# Patient Record
Sex: Female | Born: 1935 | Race: Black or African American | Hispanic: No | Smoking: Current every day smoker
Health system: Southern US, Community
[De-identification: ages and names within clinical notes are randomized; demographics above are authoritative.]

## PROBLEM LIST (undated history)

## (undated) DIAGNOSIS — M1990 Unspecified osteoarthritis, unspecified site: Secondary | ICD-10-CM

## (undated) DIAGNOSIS — C799 Secondary malignant neoplasm of unspecified site: Principal | ICD-10-CM

## (undated) DIAGNOSIS — I10 Essential (primary) hypertension: Secondary | ICD-10-CM

## (undated) HISTORY — DX: Secondary malignant neoplasm of unspecified site: C79.9

## (undated) HISTORY — PX: ECTOPIC PREGNANCY SURGERY: SHX613

---

## 1997-05-21 ENCOUNTER — Other Ambulatory Visit: Admission: RE | Admit: 1997-05-21 | Discharge: 1997-05-21 | Payer: Self-pay | Admitting: *Deleted

## 1997-11-20 ENCOUNTER — Emergency Department (HOSPITAL_COMMUNITY): Admission: EM | Admit: 1997-11-20 | Discharge: 1997-11-20 | Payer: Self-pay | Admitting: Emergency Medicine

## 1997-11-21 ENCOUNTER — Encounter: Payer: Self-pay | Admitting: Emergency Medicine

## 1999-09-22 ENCOUNTER — Emergency Department (HOSPITAL_COMMUNITY): Admission: EM | Admit: 1999-09-22 | Discharge: 1999-09-22 | Payer: Self-pay | Admitting: Emergency Medicine

## 1999-09-22 ENCOUNTER — Encounter: Payer: Self-pay | Admitting: Emergency Medicine

## 2010-12-17 ENCOUNTER — Emergency Department (HOSPITAL_COMMUNITY)
Admission: EM | Admit: 2010-12-17 | Discharge: 2010-12-18 | Disposition: A | Discharge status: Home / Self Care | Payer: Medicare Other | Attending: Emergency Medicine | Admitting: Emergency Medicine

## 2010-12-17 ENCOUNTER — Encounter: Payer: Self-pay | Admitting: *Deleted

## 2010-12-17 ENCOUNTER — Emergency Department (HOSPITAL_COMMUNITY): Payer: Medicare Other

## 2010-12-17 DIAGNOSIS — J40 Bronchitis, not specified as acute or chronic: Secondary | ICD-10-CM | POA: Insufficient documentation

## 2010-12-17 DIAGNOSIS — R0602 Shortness of breath: Secondary | ICD-10-CM | POA: Insufficient documentation

## 2010-12-17 DIAGNOSIS — J3489 Other specified disorders of nose and nasal sinuses: Secondary | ICD-10-CM | POA: Insufficient documentation

## 2010-12-17 DIAGNOSIS — E119 Type 2 diabetes mellitus without complications: Secondary | ICD-10-CM | POA: Insufficient documentation

## 2010-12-17 DIAGNOSIS — R509 Fever, unspecified: Secondary | ICD-10-CM | POA: Insufficient documentation

## 2010-12-17 DIAGNOSIS — I10 Essential (primary) hypertension: Secondary | ICD-10-CM | POA: Insufficient documentation

## 2010-12-17 DIAGNOSIS — J111 Influenza due to unidentified influenza virus with other respiratory manifestations: Secondary | ICD-10-CM | POA: Insufficient documentation

## 2010-12-17 HISTORY — DX: Essential (primary) hypertension: I10

## 2010-12-17 HISTORY — DX: Unspecified osteoarthritis, unspecified site: M19.90

## 2010-12-17 LAB — BASIC METABOLIC PANEL
BUN: 9 mg/dL (ref 6–23)
CO2: 29 mEq/L (ref 19–32)
Calcium: 9.7 mg/dL (ref 8.4–10.5)
GFR calc non Af Amer: 79 mL/min — ABNORMAL LOW (ref >90)
Glucose, Bld: 110 mg/dL — ABNORMAL HIGH (ref 70–99)
Potassium: 3.5 mEq/L (ref 3.5–5.1)

## 2010-12-17 LAB — DIFFERENTIAL
Basophils Absolute: 0.1 10*3/uL (ref 0.0–0.1)
Basophils Relative: 1 % (ref 0–1)
Eosinophils Absolute: 0.3 10*3/uL (ref 0.0–0.7)
Lymphocytes Relative: 15 % (ref 12–46)
Lymphs Abs: 0.9 10*3/uL (ref 0.7–4.0)
Monocytes Absolute: 0.7 10*3/uL (ref 0.1–1.0)
Neutro Abs: 4 10*3/uL (ref 1.7–7.7)

## 2010-12-17 LAB — CBC
HCT: 42.6 % (ref 36.0–46.0)
Hemoglobin: 14.7 g/dL (ref 12.0–15.0)
MCH: 32.2 pg (ref 26.0–34.0)
MCHC: 34.5 g/dL (ref 30.0–36.0)
RDW: 12.2 % (ref 11.5–15.5)

## 2010-12-17 LAB — TROPONIN I: Troponin I: <0.3 ng/mL (ref <0.30)

## 2010-12-17 MED ORDER — CLONIDINE HCL 0.1 MG PO TABS
0.1000 mg | ORAL_TABLET | Freq: Once | ORAL | Status: AC
  Administered 2010-12-17: 0.1 mg via ORAL
  Filled 2010-12-17: qty 1

## 2010-12-17 MED ORDER — DEXTROSE 5 % IV SOLN
1.0000 g | Freq: Once | INTRAVENOUS | Status: AC
  Administered 2010-12-17: 1 g via INTRAVENOUS
  Filled 2010-12-17: qty 10

## 2010-12-17 MED ORDER — MOXIFLOXACIN HCL IN NACL 400 MG/250ML IV SOLN
400.0000 mg | Freq: Once | INTRAVENOUS | Status: AC
  Administered 2010-12-17: 400 mg via INTRAVENOUS
  Filled 2010-12-17: qty 250

## 2010-12-17 MED ORDER — ACETAMINOPHEN 325 MG PO TABS
650.0000 mg | ORAL_TABLET | Freq: Once | ORAL | Status: AC
  Administered 2010-12-17: 650 mg via ORAL
  Filled 2010-12-17: qty 2

## 2010-12-17 NOTE — ED Notes (Signed)
Pt has had URI for the past 2 days.  Pt has been sob.  No fever or chills with this

## 2010-12-17 NOTE — ED Notes (Signed)
Dr. Doylene Canard @ at bedside evaluating patient

## 2010-12-17 NOTE — ED Notes (Addendum)
PT placed on O2 at 2L 

## 2010-12-18 MED ORDER — OSELTAMIVIR PHOSPHATE 75 MG PO CAPS: 75.0000 mg | ORAL_CAPSULE | Freq: Two times a day (BID) | ORAL | Status: AC

## 2010-12-18 MED ORDER — ALBUTEROL SULFATE HFA 108 (90 BASE) MCG/ACT IN AERS: 2.0000 | INHALATION_SPRAY | RESPIRATORY_TRACT | Status: DC | PRN

## 2010-12-18 MED ORDER — ALBUTEROL SULFATE HFA 108 (90 BASE) MCG/ACT IN AERS
2.0000 | INHALATION_SPRAY | RESPIRATORY_TRACT | Status: DC | PRN
  Administered 2010-12-18: 2 via RESPIRATORY_TRACT
  Filled 2010-12-18: qty 6.7

## 2010-12-18 MED ORDER — PREDNISONE 20 MG PO TABS
60.0000 mg | ORAL_TABLET | Freq: Once | ORAL | Status: AC
  Administered 2010-12-18: 60 mg via ORAL
  Filled 2010-12-18: qty 3

## 2010-12-18 MED ORDER — OSELTAMIVIR PHOSPHATE 75 MG PO CAPS
75.0000 mg | ORAL_CAPSULE | ORAL | Status: AC
  Administered 2010-12-18: 75 mg via ORAL
  Filled 2010-12-18: qty 1

## 2010-12-18 MED ORDER — HYDROCODONE-ACETAMINOPHEN 5-325 MG PO TABS: 2.0000 | ORAL_TABLET | ORAL | Status: AC | PRN

## 2010-12-18 MED ORDER — PREDNISONE 20 MG PO TABS: 40.0000 mg | ORAL_TABLET | Freq: Every day | ORAL | Status: AC

## 2010-12-18 NOTE — ED Provider Notes (Signed)
History     CSN: [id number] Arrival date & time: 12/17/2010  8:15 PM   First MD Initiated Contact with Patient 12/17/10 2101      Chief Complaint  Patient presents with  . Shortness of Breath    (Consider location/radiation/quality/duration/timing/severity/associated sxs/prior treatment) Patient is a 77 y.o. female presenting with shortness of breath. The history is provided by the patient and a relative.  Shortness of Breath  The current episode started 2 days ago. The onset was gradual. The problem occurs frequently. The problem has been unchanged. The problem is mild. The symptoms are relieved by rest. Associated symptoms include a fever, rhinorrhea, cough and shortness of breath. Pertinent negatives include no chest pain, no chest pressure, no orthopnea, no sore throat, no stridor and no wheezing. The fever has been present for less than 1 day. The maximum temperature noted was 101.0 to 102.1 F. The temperature was taken using an oral thermometer. The cough is productive. There is no color change associated with the cough. Nothing relieves the cough. The cough is worsened by activity. The rhinorrhea has been occurring frequently. The nasal discharge has a clear appearance. She was not exposed to toxic fumes. She has not inhaled smoke recently. She has had no prior steroid use. Her past medical history does not include asthma. She has been behaving normally. Urine output has been normal. There were no sick contacts. She has received no recent medical care.    Past Medical History  Diagnosis Date  . Hypertension   . Diabetes mellitus   . Arthritis     Past Surgical History  Procedure Date  . Ectopic pregnancy surgery     History reviewed. No pertinent family history.  History  Substance Use Topics  . Smoking status: Not on file  . Smokeless tobacco: Not on file  . Alcohol Use: No    OB History    Grav Para Term Preterm Abortions TAB SAB Ect Mult Living                   Review of Systems  Constitutional: Positive for fever and fatigue. Negative for chills, diaphoresis, activity change, appetite change and unexpected weight change.  HENT: Positive for congestion, rhinorrhea and postnasal drip. Negative for hearing loss, ear pain, nosebleeds, sore throat, facial swelling, sneezing, drooling, mouth sores, trouble swallowing, neck pain, neck stiffness, dental problem, voice change, sinus pressure, tinnitus and ear discharge.   Eyes: Negative for photophobia, pain, discharge, redness and itching.  Respiratory: Positive for cough and shortness of breath. Negative for choking, chest tightness, wheezing and stridor.   Cardiovascular: Negative for chest pain, palpitations, orthopnea and leg swelling.  Gastrointestinal: Negative for nausea, vomiting, abdominal pain, diarrhea, constipation, blood in stool, abdominal distention and anal bleeding.  Genitourinary: Negative for dysuria, urgency, frequency, hematuria, flank pain and difficulty urinating.  Musculoskeletal: Positive for myalgias. Negative for back pain, joint swelling, arthralgias and gait problem.  Skin: Negative for color change, pallor, rash and wound.  Neurological: Negative for dizziness, weakness, light-headedness and headaches.  Hematological: Positive for adenopathy. Does not bruise/bleed easily.  Psychiatric/Behavioral: Negative.     Allergies  Review of patient's allergies indicates no known allergies.  Home Medications   Current Outpatient Rx  Name Route Sig Dispense Refill  . VITAMIN C PO Oral Take 1 tablet by mouth daily.      Marland Kitchen VITAMIN B 12 PO Oral Take by mouth.      Marland Kitchen HYDROCHLOROTHIAZIDE 12.5 MG PO CAPS Oral Take  12.5 mg by mouth daily.      Marland Kitchen VERAPAMIL HCL 180 MG (CO) PO TB24 Oral Take 180 mg by mouth at bedtime.      Marland Kitchen VITAMIN E PO Oral Take 1 tablet by mouth daily.      . ALBUTEROL SULFATE HFA 108 (90 BASE) MCG/ACT IN AERS Inhalation Inhale 2 puffs into the lungs every 4 (four)  hours as needed for wheezing or shortness of breath. 1 Inhaler 0  . HYDROCODONE-ACETAMINOPHEN 5-325 MG PO TABS Oral Take 2 tablets by mouth every 4 (four) hours as needed for pain (cough). 20 tablet 0  . OSELTAMIVIR PHOSPHATE 75 MG PO CAPS Oral Take 1 capsule (75 mg total) by mouth 2 (two) times daily. 10 capsule 0  . PREDNISONE 20 MG PO TABS Oral Take 2 tablets (40 mg total) by mouth daily. 10 tablet 0    BP 203/100  Pulse 85  Temp(Src) 101.4 F (38.6 C) (Oral)  Resp 18  SpO2 95%  Physical Exam  Nursing note and vitals reviewed. Constitutional: She is oriented to person, place, and time. She appears well-developed and well-nourished. No distress.  HENT:  Head: Normocephalic and atraumatic.  Right Ear: External ear normal.  Left Ear: External ear normal.  Nose: Mucosal edema and rhinorrhea present. No sinus tenderness. Right sinus exhibits no maxillary sinus tenderness and no frontal sinus tenderness. Left sinus exhibits no maxillary sinus tenderness and no frontal sinus tenderness.  Mouth/Throat: Oropharynx is clear and moist. No oropharyngeal exudate.  Eyes: Conjunctivae and EOM are normal. Pupils are equal, round, and reactive to light. Right eye exhibits no discharge. Left eye exhibits no discharge.  Neck: Normal range of motion. Neck supple. No JVD present. No tracheal deviation present.  Cardiovascular: Normal rate, regular rhythm, normal heart sounds and intact distal pulses.  Exam reveals no gallop and no friction rub.   No murmur heard. Pulmonary/Chest: Breath sounds normal. No accessory muscle usage or stridor. Tachypnea noted. No respiratory distress. She has no decreased breath sounds. She has no wheezes. She has no rhonchi. She has no rales. She exhibits no tenderness.  Abdominal: Soft. Bowel sounds are normal. She exhibits no distension. There is no tenderness. There is no rebound and no guarding.  Musculoskeletal: Normal range of motion. She exhibits no edema and no  tenderness.  Lymphadenopathy:    She has no cervical adenopathy.  Neurological: She is alert and oriented to person, place, and time. She has normal reflexes. No cranial nerve deficit. She exhibits normal muscle tone. Coordination normal.  Skin: Skin is warm and dry. No rash noted. She is not diaphoretic. No erythema. No pallor.  Psychiatric: She has a normal mood and affect. Her behavior is normal. Judgment and thought content normal.    ED Course  Procedures (including critical care time)  Labs Reviewed  BASIC METABOLIC PANEL - Abnormal; Notable for the following:    Sodium 128 (*)    Chloride 89 (*)    Glucose, Bld 110 (*)    GFR calc non Af Amer 79 (*)    All other components within normal limits  CBC  DIFFERENTIAL  PRO B NATRIURETIC PEPTIDE  TROPONIN I  CULTURE, BLOOD (ROUTINE X 2)  CULTURE, BLOOD (ROUTINE X 2)   Dg Pneumonia Chest 2v  12/17/2010  *RADIOLOGY REPORT*  Clinical Data: Severe shortness of breath; fever, hypoxia, cough and weakness.  Hypertension.  CHEST - 2 VIEW  Comparison: None.  Findings: The lungs are well-aerated.  Minimal bibasilar opacities  likely reflect atelectasis.  There is no evidence of pleural effusion or pneumothorax.  The heart is normal in size; calcification is noted within the aortic arch.  No acute osseous abnormalities are seen.  IMPRESSION: Minimal bibasilar airspace opacities likely reflect atelectasis.  Original Report Authenticated By: Tonia Ghent, M.D.     1. Influenza   2. Bronchitis       MDM  The patient appears to have influenza-like symptoms, with a fever, and likely actual influenza. I have treated her similar to a bronchitis as well, with steroid and beta agonist inhaler for her cough which has provided improvement for her. There is no pneumonia apparent on her chest x-ray. Her blood work is normal. The patient is at this time in no apparent distress, and I went by her bedside to reevaluate her, with improved air exchange,  decreased tachypnea, and improved overall appearance, I asked the patient directly if she felt well enough to go home where she felt that she needed admission, as the picture was equivocal from my perspective and I was interested in her subjective impression on how she was doing. She stated that she felt well enough to go home and would like to be discharged home to followup with her primary care physician. I reviewed return symptoms with her and advised her to come back to the emergency department if symptoms worsen. She stated her understanding of and agreement with the plan of care.        Felisa Bonier, MD 12/18/10 [phone]

## 2010-12-18 NOTE — ED Notes (Signed)
Provided education to pt and family regarding albuterol inhaler. Pt verbalizes understanding and able to correctly return demonstration

## 2010-12-24 LAB — CULTURE, BLOOD (ROUTINE X 2): Culture  Setup Time: 201211290410

## 2011-01-13 ENCOUNTER — Encounter (HOSPITAL_COMMUNITY): Payer: Self-pay | Admitting: *Deleted

## 2011-01-13 ENCOUNTER — Emergency Department (HOSPITAL_COMMUNITY): Payer: Medicare Other

## 2011-01-13 ENCOUNTER — Inpatient Hospital Stay (HOSPITAL_COMMUNITY)
Admission: EM | Admit: 2011-01-13 | Discharge: 2011-01-17 | DRG: 156 | Disposition: A | Discharge status: Home / Self Care | Payer: Medicare Other | Attending: Internal Medicine | Admitting: Internal Medicine

## 2011-01-13 DIAGNOSIS — F17200 Nicotine dependence, unspecified, uncomplicated: Secondary | ICD-10-CM | POA: Diagnosis present

## 2011-01-13 DIAGNOSIS — E119 Type 2 diabetes mellitus without complications: Secondary | ICD-10-CM | POA: Diagnosis present

## 2011-01-13 DIAGNOSIS — K118 Other diseases of salivary glands: Secondary | ICD-10-CM

## 2011-01-13 DIAGNOSIS — K1120 Sialoadenitis, unspecified: Principal | ICD-10-CM | POA: Diagnosis present

## 2011-01-13 DIAGNOSIS — I10 Essential (primary) hypertension: Secondary | ICD-10-CM | POA: Diagnosis present

## 2011-01-13 LAB — COMPREHENSIVE METABOLIC PANEL
AST: 28 U/L (ref 0–37)
Albumin: 3.1 g/dL — ABNORMAL LOW (ref 3.5–5.2)
BUN: 11 mg/dL (ref 6–23)
Calcium: 9.6 mg/dL (ref 8.4–10.5)
Chloride: 95 mEq/L — ABNORMAL LOW (ref 96–112)
Creatinine, Ser: 0.63 mg/dL (ref 0.50–1.10)
GFR calc non Af Amer: 82 mL/min — ABNORMAL LOW (ref >90)
Total Bilirubin: 0.5 mg/dL (ref 0.3–1.2)

## 2011-01-13 LAB — CREATININE, SERUM
Creatinine, Ser: 0.59 mg/dL (ref 0.50–1.10)
GFR calc non Af Amer: 84 mL/min — ABNORMAL LOW (ref >90)

## 2011-01-13 LAB — DIFFERENTIAL
Basophils Absolute: 0.1 10*3/uL (ref 0.0–0.1)
Basophils Relative: 1 % (ref 0–1)
Eosinophils Relative: 3 % (ref 0–5)
Monocytes Absolute: 1.5 10*3/uL — ABNORMAL HIGH (ref 0.1–1.0)
Monocytes Relative: 14 % — ABNORMAL HIGH (ref 3–12)
Neutro Abs: 7 10*3/uL (ref 1.7–7.7)

## 2011-01-13 LAB — CBC
HCT: 37.6 % (ref 36.0–46.0)
Hemoglobin: 13.1 g/dL (ref 12.0–15.0)
Hemoglobin: 13.3 g/dL (ref 12.0–15.0)
MCH: 32.2 pg (ref 26.0–34.0)
MCH: 32 pg (ref 26.0–34.0)
MCHC: 34.8 g/dL (ref 30.0–36.0)
MCV: 92.4 fL (ref 78.0–100.0)
RBC: 4.16 MIL/uL (ref 3.87–5.11)
RDW: 12.3 % (ref 11.5–15.5)

## 2011-01-13 LAB — C-REACTIVE PROTEIN: CRP: 10.66 mg/dL — ABNORMAL HIGH (ref <0.60)

## 2011-01-13 LAB — SEDIMENTATION RATE: Sed Rate: 75 mm/hr — ABNORMAL HIGH (ref 0–22)

## 2011-01-13 MED ORDER — SODIUM CHLORIDE 0.9 % IV BOLUS (SEPSIS)
1000.0000 mL | Freq: Once | INTRAVENOUS | Status: AC
  Administered 2011-01-13: 1000 mL via INTRAVENOUS

## 2011-01-13 MED ORDER — HYDROCHLOROTHIAZIDE 12.5 MG PO CAPS
25.0000 mg | ORAL_CAPSULE | Freq: Every day | ORAL | Status: DC
  Administered 2011-01-14 – 2011-01-17 (×4): 25 mg via ORAL
  Filled 2011-01-13 (×5): qty 2

## 2011-01-13 MED ORDER — MORPHINE SULFATE 4 MG/ML IJ SOLN
4.0000 mg | INTRAMUSCULAR | Status: DC | PRN
  Administered 2011-01-13 – 2011-01-16 (×9): 4 mg via INTRAVENOUS
  Filled 2011-01-13 (×10): qty 1

## 2011-01-13 MED ORDER — SODIUM CHLORIDE 0.9 % IV SOLN
3.0000 g | Freq: Four times a day (QID) | INTRAVENOUS | Status: DC
  Administered 2011-01-13 – 2011-01-16 (×12): 3 g via INTRAVENOUS
  Filled 2011-01-13 (×15): qty 3

## 2011-01-13 MED ORDER — VERAPAMIL HCL ER 180 MG PO TBCR
180.0000 mg | EXTENDED_RELEASE_TABLET | Freq: Every day | ORAL | Status: DC
  Administered 2011-01-14 – 2011-01-17 (×3): 180 mg via ORAL
  Filled 2011-01-13 (×5): qty 1

## 2011-01-13 MED ORDER — VERAPAMIL HCL 180 MG (CO) PO TB24: 180.0000 mg | ORAL_TABLET | Freq: Every day | ORAL | Status: DC

## 2011-01-13 MED ORDER — ACETAMINOPHEN 650 MG RE SUPP: 650.0000 mg | Freq: Four times a day (QID) | RECTAL | Status: DC | PRN

## 2011-01-13 MED ORDER — MORPHINE SULFATE 4 MG/ML IJ SOLN
4.0000 mg | Freq: Once | INTRAMUSCULAR | Status: AC
  Administered 2011-01-13: 4 mg via INTRAVENOUS
  Filled 2011-01-13: qty 1

## 2011-01-13 MED ORDER — DEXTROSE-NACL 5-0.45 % IV SOLN
INTRAVENOUS | Status: DC
  Administered 2011-01-13: 1000 mL via INTRAVENOUS
  Administered 2011-01-15 – 2011-01-16 (×2): via INTRAVENOUS

## 2011-01-13 MED ORDER — NICOTINE 21 MG/24HR TD PT24
21.0000 mg | MEDICATED_PATCH | Freq: Every day | TRANSDERMAL | Status: DC
  Administered 2011-01-13 – 2011-01-17 (×5): 21 mg via TRANSDERMAL
  Filled 2011-01-13 (×5): qty 1

## 2011-01-13 MED ORDER — ACETAMINOPHEN 325 MG PO TABS
650.0000 mg | ORAL_TABLET | Freq: Four times a day (QID) | ORAL | Status: DC | PRN
  Administered 2011-01-13 – 2011-01-14 (×2): 650 mg via ORAL
  Filled 2011-01-13 (×2): qty 2

## 2011-01-13 MED ORDER — POLYETHYLENE GLYCOL 3350 17 G PO PACK
17.0000 g | PACK | Freq: Every day | ORAL | Status: DC | PRN
  Filled 2011-01-13: qty 1

## 2011-01-13 MED ORDER — ONDANSETRON HCL 4 MG/2ML IJ SOLN
4.0000 mg | Freq: Once | INTRAMUSCULAR | Status: AC
  Administered 2011-01-13: 4 mg via INTRAVENOUS
  Filled 2011-01-13: qty 2

## 2011-01-13 MED ORDER — IOHEXOL 300 MG/ML  SOLN
75.0000 mL | Freq: Once | INTRAMUSCULAR | Status: AC | PRN
  Administered 2011-01-13: 75 mL via INTRAVENOUS

## 2011-01-13 MED ORDER — ENOXAPARIN SODIUM 40 MG/0.4ML ~~LOC~~ SOLN
40.0000 mg | SUBCUTANEOUS | Status: DC
  Administered 2011-01-13 – 2011-01-15 (×3): 40 mg via SUBCUTANEOUS
  Filled 2011-01-13 (×5): qty 0.4

## 2011-01-13 MED ORDER — SODIUM CHLORIDE 0.9 % IV SOLN
Freq: Once | INTRAVENOUS | Status: AC
  Administered 2011-01-13: 1000 mL via INTRAVENOUS

## 2011-01-13 MED ORDER — ONDANSETRON HCL 4 MG PO TABS: 4.0000 mg | ORAL_TABLET | Freq: Four times a day (QID) | ORAL | Status: DC | PRN

## 2011-01-13 MED ORDER — HYDROMORPHONE HCL PF 1 MG/ML IJ SOLN
0.5000 mg | Freq: Once | INTRAMUSCULAR | Status: AC
  Administered 2011-01-13: 0.5 mg via INTRAVENOUS
  Filled 2011-01-13: qty 1

## 2011-01-13 MED ORDER — SODIUM CHLORIDE 0.9 % IV SOLN
3.0000 g | Freq: Once | INTRAVENOUS | Status: AC
  Administered 2011-01-13: 3 g via INTRAVENOUS
  Filled 2011-01-13: qty 3

## 2011-01-13 MED ORDER — ONDANSETRON HCL 4 MG/2ML IJ SOLN
4.0000 mg | Freq: Four times a day (QID) | INTRAMUSCULAR | Status: DC | PRN
  Administered 2011-01-14: 4 mg via INTRAVENOUS
  Filled 2011-01-13: qty 2

## 2011-01-13 NOTE — Progress Notes (Signed)
ANTIBIOTIC CONSULT NOTE - INITIAL  Pharmacy Consult for Unasyn  Indication: Possible parotitis  No Known Allergies  Patient Measurements: Height: 5\' 4"  (162.6 cm) Weight: 158 lb 11.7 oz (72 kg) IBW/kg (Calculated) : 54.7    Vital Signs: Temp: 98.6 F (37 C) (12/25 1444) Temp src: Oral (12/25 1444) BP: 136/46 mmHg (12/25 1444) Pulse Rate: 70  (12/25 1444) Intake/Output from previous day:   Intake/Output from this shift:    Labs:  Basename 01/13/11 1109  WBC 11.0*  HGB 13.1  PLT 322  LABCREA --  CREATININE 0.63   Estimated Creatinine Clearance: 53.6 ml/min (by C-G formula based on Cr of 0.63). No results found for this basename: VANCOTROUGH:2,VANCOPEAK:2,VANCORANDOM:2,GENTTROUGH:2,GENTPEAK:2,GENTRANDOM:2,TOBRATROUGH:2,TOBRAPEAK:2,TOBRARND:2,AMIKACINPEAK:2,AMIKACINTROU:2,AMIKACIN:2, in the last 72 hours   Microbiology: Recent Results (from the past 720 hour(s))  CULTURE, BLOOD (ROUTINE X 2)     Status: Normal   Collection Time   12/17/10 10:05 PM      Component Value Range Status Comment   Specimen Description BLOOD LEFT ARM   Final    Special Requests BOTTLES DRAWN AEROBIC AND ANAEROBIC [facility]   Final    Setup Time [id number]   Final    Culture NO GROWTH 5 DAYS   Final    Report Status 12/24/2010 FINAL   Final   CULTURE, BLOOD (ROUTINE X 2)     Status: Normal   Collection Time   12/17/10 10:11 PM      Component Value Range Status Comment   Specimen Description BLOOD LEFT HAND   Final    Special Requests BOTTLES DRAWN AEROBIC AND ANAEROBIC [facility]   Final    Setup Time [id number]   Final    Culture NO GROWTH 5 DAYS   Final    Report Status 12/24/2010 FINAL   Final     Medical History: Past Medical History  Diagnosis Date  . Hypertension   . Diabetes mellitus   . Arthritis     Medications:  Scheduled:    . sodium chloride   Intravenous Once  . ampicillin-sulbactam (UNASYN) IV  3 g Intravenous Once  . enoxaparin  40 mg Subcutaneous Q24H  .  hydrochlorothiazide  25 mg Oral Daily  .  HYDROmorphone (DILAUDID) injection  0.5 mg Intravenous Once  .  morphine injection  4 mg Intravenous Once  . nicotine  21 mg Transdermal Daily  . ondansetron (ZOFRAN) IV  4 mg Intravenous Once  . sodium chloride  1,000 mL Intravenous Once  . verapamil  180 mg Oral Daily  . DISCONTD: verapamil  180 mg Oral QHS   Assessment: [name] with a hx of HTN and diet-controlled DM presents with painful bilateral lower jaw swelling to start Unasyn for possible parotitis (on right). Received one dose of Unasyn 3g at 1235 today. WBC 11.0, CrCl ~50 ml/min, afebrile. No cultures yet this admission. CT with R>L parotid gland enlargement by cystic and solid masses; associated inflammation more prominent on the right; possible etiologies include neoplastic and infectious/inflammatory disease.  Goal of Therapy:  Resolution of infection  Plan:  1. Unasyn 3g IV q6h 2. F/u renal fxn, s/sx of infection, cx, length of therapy   Sonia Weeks 01/13/2011,3:03 PM

## 2011-01-13 NOTE — ED Notes (Signed)
Reports swollen lymph nodes to bil neck, rigt side has increased in size since Sunday. Large swollen area noted, airway is intact.

## 2011-01-13 NOTE — ED Notes (Signed)
Report to Janet, RN.

## 2011-01-13 NOTE — Consult Note (Signed)
Reason for Consult:Facial swelling Referring Physician: ED  Sonia Weeks is an 77 y.o. female.  HPI: One year history of bilateral small and non-tender parotid masses. About 3 days ago, they both started to suddenly get much larger and the right side has been tender. She has a chronic dry mouth.   Past Medical History  Diagnosis Date  . Hypertension   . Diabetes mellitus   . Arthritis     Past Surgical History  Procedure Date  . Ectopic pregnancy surgery     History reviewed. No pertinent family history.  Social History:  does not have a smoking history on file. She does not have any smokeless tobacco history on file. She reports that she does not drink alcohol or use illicit drugs.  Allergies: No Known Allergies  Medications: I have reviewed the patient's current medications.  Results for orders placed during the hospital encounter of 01/13/11 (from the past 48 hour(s))  CBC     Status: Abnormal   Collection Time   01/13/11 11:09 AM      Component Value Range Comment   WBC 11.0 (*) 4.0 - 10.5 (K/uL)    RBC 4.07  3.87 - 5.11 (MIL/uL)    Hemoglobin 13.1  12.0 - 15.0 (g/dL)    HCT 09.8  11.9 - 14.7 (%)    MCV 92.4  78.0 - 100.0 (fL)    MCH 32.2  26.0 - 34.0 (pg)    MCHC 34.8  30.0 - 36.0 (g/dL)    RDW 82.9  56.2 - 13.0 (%)    Platelets 322  150 - 400 (K/uL)   DIFFERENTIAL     Status: Abnormal   Collection Time   01/13/11 11:09 AM      Component Value Range Comment   Neutrophils Relative 64  43 - 77 (%)    Neutro Abs 7.0  1.7 - 7.7 (K/uL)    Lymphocytes Relative 19  12 - 46 (%)    Lymphs Abs 2.1  0.7 - 4.0 (K/uL)    Monocytes Relative 14 (*) 3 - 12 (%)    Monocytes Absolute 1.5 (*) 0.1 - 1.0 (K/uL)    Eosinophils Relative 3  0 - 5 (%)    Eosinophils Absolute 0.3  0.0 - 0.7 (K/uL)    Basophils Relative 1  0 - 1 (%)    Basophils Absolute 0.1  0.0 - 0.1 (K/uL)   COMPREHENSIVE METABOLIC PANEL     Status: Abnormal   Collection Time   01/13/11 11:09 AM   Component Value Range Comment   Sodium 133 (*) 135 - 145 (mEq/L)    Potassium 3.9  3.5 - 5.1 (mEq/L)    Chloride 95 (*) 96 - 112 (mEq/L)    CO2 28  19 - 32 (mEq/L)    Glucose, Bld 92  70 - 99 (mg/dL)    BUN 11  6 - 23 (mg/dL)    Creatinine, Ser 8.65  0.50 - 1.10 (mg/dL)    Calcium 9.6  8.4 - 10.5 (mg/dL)    Total Protein 7.7  6.0 - 8.3 (g/dL)    Albumin 3.1 (*) 3.5 - 5.2 (g/dL)    AST 28  0 - 37 (U/L)    ALT 12  0 - 35 (U/L)    Alkaline Phosphatase 79  39 - 117 (U/L)    Total Bilirubin 0.5  0.3 - 1.2 (mg/dL)    GFR calc non Af Amer 82 (*) >90 (mL/min)    GFR calc  Af Amer >90  >90 (mL/min)   PROTIME-INR     Status: Normal   Collection Time   01/13/11 11:09 AM      Component Value Range Comment   Prothrombin Time 13.7  11.6 - 15.2 (seconds)    INR 1.03  0.00 - 1.49    SEDIMENTATION RATE     Status: Abnormal   Collection Time   01/13/11 11:09 AM      Component Value Range Comment   Sed Rate 75 (*) 0 - 22 (mm/hr)     Dg Chest 2 View  01/13/2011  *RADIOLOGY REPORT*  Clinical Data: Right neck and throat swelling.  Diabetes and hypertension  CHEST - 2 VIEW  Comparison: 12/17/2010  Findings: Heart size is normal.  Both lungs are clear.  No evidence of pleural effusion.  No mass or lymphadenopathy identified.  IMPRESSION: Stable exam.  No active disease.  Original Report Authenticated By: Danae Orleans, M.D.   Ct Soft Tissue Neck W Contrast  01/13/2011  *RADIOLOGY REPORT*  Clinical Data: Bilateral neck swelling.  CT NECK WITH CONTRAST  Technique:  Multidetector CT imaging of the neck was performed with intravenous contrast.  Contrast: 75mL OMNIPAQUE IOHEXOL 300 MG/ML IV SOLN 80  Comparison: None.  Findings: There are bilateral parotid gland masses.  On the right, there are mixed solid cystic masses.  The largest mass is a complex primarily cystic mass measuring 6 cm x 4.2 cm x 4.5 cm.  On the left, the dominant nodule is mostly solid, measuring 4.8 cm x 3.8 cm x 5.8 cm.  There is a smaller  solid nodule/mass along the posterior inferior margin of the right parotid gland.  Overall, the right parotid gland is larger than the left, and there is inflammation in the adjacent fat.  Fat inflammation is significantly less adjacent to the left parotid gland.  There is inflammation adjacent to the right and left submandibular glands, greater on the right, which is felt to be from the prior glands and not from primary submandibular gland inflammation.  There are no pathologically enlarged lymph nodes, but there are multiple sub centimeter peri jugular lymph nodes more notable on the right.  The mucosal space is unremarkable with no significant asymmetry or masses.  The thyroid gland is within normal limits.  The visceral structures of the skull base are within normal limits.  There is mucosal thickening in both maxillary sinuses.  The mastoid air cells and middle ear cavities are clear.  Degenerative changes are noted throughout the cervical spine.  The lung apices show changes of mild emphysema are otherwise clear.  There are vascular calcifications at the carotid bifurcations bilaterally.  IMPRESSION: Right greater left parotid gland enlargement by cystic and solid masses.  The left parotid mass is predominately solid.  There are both primarily cystic and primarily solid nodules/masses in the right parotid gland.  There is associated inflammation more prominent on the right.  Possible etiologies include neoplastic and infectious/inflammatory disease.  Original Report Authenticated By:     [id number] negative.  Blood pressure 136/46, pulse 70, temperature 98.6 F (37 C), temperature source Oral, resp. rate 18, height 5\' 4"  (1.626 m), weight 72 kg (158 lb 11.7 oz), SpO2 93.00%.  PHYSICAL EXAM: Overall appearance:  Healthy appearing, in no distress Head:  Normocephalic, atraumatic. Ears: External auditory canals are clear; tympanic membranes are intact in the middle ears are free of any  effusion. Nose: External nose is healthy in appearance. Internal nasal exam free  of any lesions or obstruction. Oral Cavity:  There are no mucosal lesions or masses identified. Mucosa dry. No salivary secretions expressible from the parotid ducts bilaterally. Oral Pharynx/Hypopharynx/Larynx: no signs of any mucosal lesions or masses identified. . Neuro:  No identifiable neurologic deficits. Facial nerve with normal function bilaterally. Neck: Massive enlargement of the parotids, worse on the right. No skin erythema. Minimal tenderness. Both sides tense with what feels to be fluid filled masses.  Studies Reviewed: CT reviewed. Bilateral parotid masses, right side appears fluid filled, left side more solid and inflammatory.   Assessment/Plan: Bilateral parotid masses/cysts. Clinically not abscess although the very mild leukocytosis and the new onset swelling and tenderness raise the possibility of early infection. Recommend IV hydration, broad spectrum antibiotic, and analgesics. We will follow clinically and determine if drainage procedure may be indicated.   Siraj Dermody H 01/13/2011, 3:53 PM

## 2011-01-13 NOTE — ED Provider Notes (Signed)
History     CSN: [id number]  Arrival date & time 01/13/11  1051   First MD Initiated Contact with Patient 01/13/11 1106      Chief Complaint  Patient presents with  . Adenopathy    (Consider location/radiation/quality/duration/timing/severity/associated sxs/prior treatment) HPI Comments: Patient presents with bilateral neck swelling for the past 2 days. The right side of her neck is much more swollen than the left but both sides are tender.  She reports about one year ago she was told she had a problem with her salivary glands and has had no problems since. Swelling has increased in size over the past 2 days. She denies any fever, vomiting, difficulty breathing or swallowing. She denies any chest pain, shortness of breath, abdominal pain. Denies swelling in any other locations.  The history is provided by the patient.    Past Medical History  Diagnosis Date  . Hypertension   . Diabetes mellitus   . Arthritis     Past Surgical History  Procedure Date  . Ectopic pregnancy surgery     History reviewed. No pertinent family history.  History  Substance Use Topics  . Smoking status: Not on file  . Smokeless tobacco: Not on file  . Alcohol Use: No    OB History    Grav Para Term Preterm Abortions TAB SAB Ect Mult Living                  Review of Systems  Constitutional: Negative for fever, activity change and appetite change.  HENT: Positive for facial swelling. Negative for congestion, sore throat and trouble swallowing.   Respiratory: Negative for cough, chest tightness and stridor.   Cardiovascular: Negative for chest pain.  Gastrointestinal: Negative for nausea, vomiting and abdominal pain.  Genitourinary: Negative for dysuria.  Musculoskeletal: Negative for back pain.  Skin: Negative for rash.  Neurological: Negative for weakness and headaches.  Hematological: Positive for adenopathy.    Allergies  Review of patient's allergies indicates no known  allergies.  Home Medications   No current outpatient prescriptions on file.  BP 136/46  Pulse 70  Temp(Src) 98.6 F (37 C) (Oral)  Resp 18  Ht 5\' 4"  (1.626 m)  Wt 158 lb 11.7 oz (72 kg)  BMI 27.25 kg/m2  SpO2 93%  Physical Exam  Constitutional: She is oriented to person, place, and time. She appears well-developed and well-nourished. No distress.  HENT:  Head: Normocephalic and atraumatic.  Mouth/Throat: Oropharynx is clear and moist. No oropharyngeal exudate.       Oropharynx clear, no trismus, no tongue elevation No purulence near Stensen's duct  Eyes: Conjunctivae and EOM are normal. Pupils are equal, round, and reactive to light.  Neck: Normal range of motion. Neck supple.       Significant bilateral swelling, right > left. There is a large, firm tender mass overlying the right angle of the mandible involving the earlobe and superior neck.  No overlying skin change, fluctuance, erythema. There is also a smaller firm tender mass over the left angle of the mandible involving the earlobe superior neck No appreciable cervical lymphadenopathy  Cardiovascular: Normal rate, regular rhythm and normal heart sounds.   No murmur heard. Pulmonary/Chest: Effort normal and breath sounds normal. No respiratory distress.  Abdominal: Soft. There is no tenderness. There is no rebound and no guarding.  Musculoskeletal: Normal range of motion. She exhibits no edema and no tenderness.  Neurological: She is alert and oriented to person, place, and time. No cranial  nerve deficit.  Skin: Skin is warm.    ED Course  Procedures (including critical care time)  Labs Reviewed  CBC - Abnormal; Notable for the following:    WBC 11.0 (*)    All other components within normal limits  DIFFERENTIAL - Abnormal; Notable for the following:    Monocytes Relative 14 (*)    Monocytes Absolute 1.5 (*)    All other components within normal limits  COMPREHENSIVE METABOLIC PANEL - Abnormal; Notable for the  following:    Sodium 133 (*)    Chloride 95 (*)    Albumin 3.1 (*)    GFR calc non Af Amer 82 (*)    All other components within normal limits  SEDIMENTATION RATE - Abnormal; Notable for the following:    Sed Rate 75 (*)    All other components within normal limits  C-REACTIVE PROTEIN - Abnormal; Notable for the following:    CRP 10.66 (*)    All other components within normal limits  CBC - Abnormal; Notable for the following:    WBC 11.0 (*)    All other components within normal limits  CREATININE, SERUM - Abnormal; Notable for the following:    GFR calc non Af Amer 84 (*)    All other components within normal limits  PROTIME-INR  BASIC METABOLIC PANEL  CBC   Dg Chest 2 View  01/13/2011  *RADIOLOGY REPORT*  Clinical Data: Right neck and throat swelling.  Diabetes and hypertension  CHEST - 2 VIEW  Comparison: 12/17/2010  Findings: Heart size is normal.  Both lungs are clear.  No evidence of pleural effusion.  No mass or lymphadenopathy identified.  IMPRESSION: Stable exam.  No active disease.  Original Report Authenticated By: Danae Orleans, M.D.   Ct Soft Tissue Neck W Contrast  01/13/2011  *RADIOLOGY REPORT*  Clinical Data: Bilateral neck swelling.  CT NECK WITH CONTRAST  Technique:  Multidetector CT imaging of the neck was performed with intravenous contrast.  Contrast: 75mL OMNIPAQUE IOHEXOL 300 MG/ML IV SOLN 80  Comparison: None.  Findings: There are bilateral parotid gland masses.  On the right, there are mixed solid cystic masses.  The largest mass is a complex primarily cystic mass measuring 6 cm x 4.2 cm x 4.5 cm.  On the left, the dominant nodule is mostly solid, measuring 4.8 cm x 3.8 cm x 5.8 cm.  There is a smaller solid nodule/mass along the posterior inferior margin of the right parotid gland.  Overall, the right parotid gland is larger than the left, and there is inflammation in the adjacent fat.  Fat inflammation is significantly less adjacent to the left parotid gland.   There is inflammation adjacent to the right and left submandibular glands, greater on the right, which is felt to be from the prior glands and not from primary submandibular gland inflammation.  There are no pathologically enlarged lymph nodes, but there are multiple sub centimeter peri jugular lymph nodes more notable on the right.  The mucosal space is unremarkable with no significant asymmetry or masses.  The thyroid gland is within normal limits.  The visceral structures of the skull base are within normal limits.  There is mucosal thickening in both maxillary sinuses.  The mastoid air cells and middle ear cavities are clear.  Degenerative changes are noted throughout the cervical spine.  The lung apices show changes of mild emphysema are otherwise clear.  There are vascular calcifications at the carotid bifurcations bilaterally.  IMPRESSION: Right greater  left parotid gland enlargement by cystic and solid masses.  The left parotid mass is predominately solid.  There are both primarily cystic and primarily solid nodules/masses in the right parotid gland.  There is associated inflammation more prominent on the right.  Possible etiologies include neoplastic and infectious/inflammatory disease.  Original Report Authenticated By:      1. Parotid mass       MDM  Bilateral neck swelling without airway involvement. No systemic symptoms. Concerning for abscess, mass, parotid pathology. Does not appear consistent with lymphadenopathy  We'll obtain labs, imaging, pain control and start empiric antibiotics.  Bilateral parotid abnormalities noted.  D/w Dr. Pollyann Kennedy of ENT.  States may be seen with dehydration in elderly and he favors obstruction over mass.  Agrees with admission for IV antibiotics and will consult on patient.   Glynn Octave, MD 01/13/11 2112

## 2011-01-13 NOTE — ED Notes (Signed)
Pt with bilateral lower jaw swelling, R<L, pt reports painful to touch swelling occurred over the last 4 days. No drooling or airway compromise noted.

## 2011-01-13 NOTE — H&P (Signed)
Primary Care Physician: Fleet Contras, MD   Chief Complaint: Acute painful neck swelling  History of Present Illness: The patient is an 77 year old woman history of hypertension and diet-controlled diabetes who presents for evaluation of acute painful neck swelling. The patient reports that for approximately the prior year she has been experiencing swelling behind the angle of her mandibles that have progressively gotten worse throughout the year. Has been noticing it but did not think too much of it. However 2 days ago she reports that the swelling on the right became acutely worse, now markedly larger and "hard as a stone." Denies any fevers, chills, sweats, night sweats. No nausea or vomiting. No new stridor or difficulty breathing. Denies any dysphasia. However, does report that she has been experiencing xerostomia for the past month or so ever since she had a bout of the flu and had been using albuterol just as a matter of course. Also reports prominent weight loss over the past year, more than she expects for a diet that she recently started.  Denies any history of parotitis or stones in the past. No known family history of head and neck cancer. She is a long-standing smoker, with at least a 70-pack-year history. Denies any alcohol or illicits. Came into the emergency room because the swelling got so painful that she needed relief.  In the emergency room, patient was afebrile, blood pressure 160/61, heart rate 88, respirations 18, satting 96% on room air. Chest x-ray with no acute cardiopulmonary disease. CT neck demonstrating bilateral carotid lesions, of which the right is predominantly cystic. Mild associated stranding and bilateral adenopathy is also seen. ENT was consulted. IV antibiotics were started for possible parotitis. The patient was admitted for further evaluation and treatment.  Past Medical/Surgical History: Hypertension, blood pressure usually in the 100s to 110s  systolic Diet-controlled diabetes  Allergies: No known drug allergies  Medications: No current facility-administered medications on file prior to encounter.   Current Outpatient Prescriptions on File Prior to Encounter  Medication Sig Dispense Refill  . albuterol (PROVENTIL HFA;VENTOLIN HFA) 108 (90 BASE) MCG/ACT inhaler Inhale 2 puffs into the lungs every 4 (four) hours as needed for wheezing or shortness of breath.  1 Inhaler  0  . Ascorbic Acid (VITAMIN C PO) Take 1 tablet by mouth daily.        . Cyanocobalamin (VITAMIN B 12 PO) Take by mouth.        . hydrochlorothiazide (MICROZIDE) 12.5 MG capsule Take 25 mg by mouth daily.       . verapamil (COVERA HS) 180 MG (CO) 24 hr tablet Take 180 mg by mouth at bedtime.        Marland Kitchen VITAMIN E PO Take 1 tablet by mouth daily.         Family history: Denies any history of coronary artery disease or malignancies  Social history: Widowed, lives by herself, otherwise active At least a 70-pack-year history of smoking Denies any alcohol or illicits  Review of Systems: General: As per HPI Skin: No rashes or lacerations HEENT: As per HPI Pulmonary: No cough, wheezing, shortness of breath Cardivascular: No chest pain, dyspnea on exertion, palpitations, lightheaded/dizziness, paroxysmal nocturnal dyspnea, orthopnea Gastrointestinal: No abdominal pain, dysphagia, odynophagia, nausea, vomiting, hematemesis, melena, hematochezia, bowel changes Genitourinary: No dysuria, hematuria, increased urinary frequency/urgency. No discharge Musculoskeletal: No muscle aches, pain. No arthritis Hematologic: No easy bruising or bleeding Neurologic: No headaches, vision changes, focal neurologic deficits Psychologic: No suicidial or homicidal ideation. No depression  Filed Vitals:  01/13/11 1057 01/13/11 1300  BP: 160/61 158/82  Pulse: 88 84  Temp: 98.1 F (36.7 C) 98.6 F (37 C)  TempSrc: Oral Oral  Resp: 18   SpO2: 96% 98%    Physical  Exam: General: Alert and oriented x 3, no apparent distress Skin: No rashes, bruises HEENT: Head atraumatic, sclera anicertic, pupils equal and reactive to light, oropharynx unable to be visualized with grade IV Mallampati score. Neck: Enlarged bilaterally in the submadibular/parotid regions with the right firm and indurated with no erythema.  The left is firm but mobile. Both sides are non-tender to palpation. No regional adenopathy. No thyromegaly Chest: Clear to auscultation bilaterally, no wheezes, rales, or ronchi Heart: Regular rate and rhythm, grade III/VI holosystolic murmur best heard left lower sternal border. No rubs or gallops. Abdomen: Soft, nontender, nondistended, + bowel sounds, no masses Extremities: No cyanosis, clubbing, or edema. 2+ radial and dorsalis pedis pulses bilaterally Neurologic: Grossly intact  Labs: CBC    Component Value Date/Time   WBC 11.0* 01/13/2011 1109   RBC 4.07 01/13/2011 1109   HGB 13.1 01/13/2011 1109   HCT 37.6 01/13/2011 1109   PLT 322 01/13/2011 1109   MCV 92.4 01/13/2011 1109   MCH 32.2 01/13/2011 1109   MCHC 34.8 01/13/2011 1109   RDW 12.3 01/13/2011 1109   LYMPHSABS 2.1 01/13/2011 1109   MONOABS 1.5* 01/13/2011 1109   EOSABS 0.3 01/13/2011 1109   BASOSABS 0.1 01/13/2011 1109    BMET    Component Value Date/Time   NA 133* 01/13/2011 1109   K 3.9 01/13/2011 1109   CL 95* 01/13/2011 1109   CO2 28 01/13/2011 1109   GLUCOSE 92 01/13/2011 1109   BUN 11 01/13/2011 1109   CREATININE 0.63 01/13/2011 1109   CALCIUM 9.6 01/13/2011 1109   GFRNONAA 82* 01/13/2011 1109   GFRAA >90 01/13/2011 1109    Liver function tests: AST 28, ALT 12, alkaline phosphatase 79, total bilirubin 0.5, total protein 7.7, albumin 3.1  Sedimentation rate 75  INR 1.03  CXR: Stable exam. No active disease.  CT neck: Right greater left parotid gland enlargement by cystic and solid  masses. The left parotid mass is predominately solid. There are  both  primarily cystic and primarily solid nodules/masses in the  right parotid gland. There is associated inflammation more  prominent on the right.  Possible etiologies include neoplastic and infectious/inflammatory  disease.  Impression/Plan: 77 year old woman history of hypertension and diet-controlled diabetes who presents for evaluation of acute painful neck swelling. Given CT findings and history, most likely etiology is acute parotitis on the right, which is likely  exacerbated by the xerostomia she has been experiencing ever since using albuterol. Underlying reason for bilateral parotid lesions is not clear, although given history malignancy is of concern.  Bilateral parotid enlargement: - ? Malignancy versus obstruction from xerostomia and superinfection - Admit to Medicine - f/u ENT recommendations - continue IV antibiotics - pain control PRN - NPO except ice chips - d/c Albuterol given xerostomia likely exacerbated by Albuterol  Hypertension: - poorly controlled, likely secondary to acute events - continue home medications  Tobacco abuse: - Nicotine patch  Grade III/VI SEM: - ? Mitral regurgitation. Patient is not aware of possible diagnosis - follow-up with PCP  Fluid/electrolytes/nutrition: - D5 1/2 NS maintenance fluids - Monitor electrolytes daily - Ice chips for now  Prophylaxis: - Lovenox  CODE STATUS: DNR  Contacts: Arlana Pouch (niece, PoA): [phone]

## 2011-01-14 ENCOUNTER — Encounter (HOSPITAL_COMMUNITY): Payer: Self-pay | Admitting: *Deleted

## 2011-01-14 LAB — CBC
HCT: 36.7 % (ref 36.0–46.0)
Hemoglobin: 12 g/dL (ref 12.0–15.0)
WBC: 9.9 10*3/uL (ref 4.0–10.5)

## 2011-01-14 LAB — BASIC METABOLIC PANEL
GFR calc Af Amer: >90 mL/min (ref >90)
GFR calc non Af Amer: 79 mL/min — ABNORMAL LOW (ref >90)
Potassium: 3.5 mEq/L (ref 3.5–5.1)
Sodium: 134 mEq/L — ABNORMAL LOW (ref 135–145)

## 2011-01-14 LAB — URINALYSIS, ROUTINE W REFLEX MICROSCOPIC
Bilirubin Urine: NEGATIVE
Glucose, UA: NEGATIVE mg/dL
Hgb urine dipstick: NEGATIVE
Specific Gravity, Urine: 1.036 — ABNORMAL HIGH (ref 1.005–1.030)
pH: 5.5 (ref 5.0–8.0)

## 2011-01-14 MED ORDER — PANTOPRAZOLE SODIUM 40 MG PO TBEC
40.0000 mg | DELAYED_RELEASE_TABLET | Freq: Every day | ORAL | Status: DC
  Administered 2011-01-14: 40 mg via ORAL
  Filled 2011-01-14: qty 1

## 2011-01-14 MED ORDER — IBUPROFEN 400 MG PO TABS
400.0000 mg | ORAL_TABLET | Freq: Four times a day (QID) | ORAL | Status: DC | PRN
  Administered 2011-01-14: 400 mg via ORAL
  Filled 2011-01-14: qty 1

## 2011-01-14 NOTE — Progress Notes (Signed)
Subjective: Patient still complaining of pain more on the right side than on the left. She relates a swallowing slightly down. She is able to swallow not having any difficulty breathing.   Objective: Filed Vitals:   01/13/11 2125 01/13/11 2350 01/14/11 0325 01/14/11 0520  BP: 153/52   130/51  Pulse: 70   88  Temp: 102.3 F (39.1 C) 100.7 F (38.2 C) 100.6 F (38.1 C) 101.3 F (38.5 C)  TempSrc:      Resp: 18   18  Height:      Weight:      SpO2:    92%   Weight change:   Intake/Output Summary (Last 24 hours) at 01/14/11 0906 Last data filed at 01/14/11 9604  Gross per 24 hour  Intake 841.25 ml  Output      3 ml  Net 838.25 ml    General: Alert, awake, oriented x3, in no acute distress.  HEENT: No bruits, no goiter. Bilateral parotid enlargement with deviation of her uvula bilaterally tender to palpation with palpable lymph nodes along the inferior aspect of both trapezius muscles. And lymph nodes tender. Heart: Regular rate and rhythm, without murmurs, rubs, gallops.  Lungs: Crackles left side, bilateral air movement.  Abdomen: Soft, nontender, nondistended, positive bowel sounds.  Neuro: Grossly intact, nonfocal.   Lab Results:  Basename 01/14/11 0600 01/13/11 1540 01/13/11 1109  NA 134* -- 133*  K 3.5 -- 3.9  CL 97 -- 95*  CO2 28 -- 28  GLUCOSE 131* -- 92  BUN 9 -- 11  CREATININE 0.71 0.59 0.63  CALCIUM 9.3 -- 9.6  MG -- -- --  PHOS -- -- --    Basename 01/13/11 1109  AST 28  ALT 12  ALKPHOS 79  BILITOT 0.5  PROT 7.7  ALBUMIN 3.1*    Basename 01/14/11 0600 01/13/11 1540 01/13/11 1109  WBC 9.9 11.0* --  NEUTROABS -- -- 7.0  HGB 12.0 13.3 --  HCT 36.7 38.7 --  MCV 94.3 93.0 --  PLT 333 329 --     Micro Results: No results found for this or any previous visit (from the past 240 hour(s)).  Studies/Results: Dg Chest 2 View  01/13/2011  *RADIOLOGY REPORT*  Clinical Data: Right neck and throat swelling.  Diabetes and hypertension  CHEST - 2  VIEW  Comparison: 12/17/2010  Findings: Heart size is normal.  Both lungs are clear.  No evidence of pleural effusion.  No mass or lymphadenopathy identified.  IMPRESSION: Stable exam.  No active disease.  Original Report Authenticated By: Danae Orleans, M.D.   Ct Soft Tissue Neck W Contrast  01/13/2011  *RADIOLOGY REPORT*  Clinical Data: Bilateral neck swelling.  CT NECK WITH CONTRAST  Technique:  Multidetector CT imaging of the neck was performed with intravenous contrast.  Contrast: 75mL OMNIPAQUE IOHEXOL 300 MG/ML IV SOLN 80  Comparison: None.  Findings: There are bilateral parotid gland masses.  On the right, there are mixed solid cystic masses.  The largest mass is a complex primarily cystic mass measuring 6 cm x 4.2 cm x 4.5 cm.  On the left, the dominant nodule is mostly solid, measuring 4.8 cm x 3.8 cm x 5.8 cm.  There is a smaller solid nodule/mass along the posterior inferior margin of the right parotid gland.  Overall, the right parotid gland is larger than the left, and there is inflammation in the adjacent fat.  Fat inflammation is significantly less adjacent to the left parotid gland.  There is  inflammation adjacent to the right and left submandibular glands, greater on the right, which is felt to be from the prior glands and not from primary submandibular gland inflammation.  There are no pathologically enlarged lymph nodes, but there are multiple sub centimeter peri jugular lymph nodes more notable on the right.  The mucosal space is unremarkable with no significant asymmetry or masses.  The thyroid gland is within normal limits.  The visceral structures of the skull base are within normal limits.  There is mucosal thickening in both maxillary sinuses.  The mastoid air cells and middle ear cavities are clear.  Degenerative changes are noted throughout the cervical spine.  The lung apices show changes of mild emphysema are otherwise clear.  There are vascular calcifications at the carotid  bifurcations bilaterally.  IMPRESSION: Right greater left parotid gland enlargement by cystic and solid masses.  The left parotid mass is predominately solid.  There are both primarily cystic and primarily solid nodules/masses in the right parotid gland.  There is associated inflammation more prominent on the right.  Possible etiologies include neoplastic and infectious/inflammatory disease.  Original Report Authenticated By:     Medications: I have reviewed the patient's current medications.   Principal Problem:  *Parotitis Active Problems:  Hypertension    Assessment and plan: -Currently on IV antibiotics, IV hydration. And IV morphine which is controlling the pain will agree with ENT Dr. to continue antibiotics to see her response. I would start her on ibuprofen to help decrease the inflammation. We'll start her on a  liquid diet. She is able to swallow not having any difficulty breathing. Blood cultures results are pending. -Awaiting further recommendations by ENT.  LOS: 1 day   Marinda Elk M.D. Pager: [phone] [facility] 01/14/2011, 9:06 AM

## 2011-01-15 MED ORDER — ONDANSETRON HCL 4 MG/2ML IJ SOLN
4.0000 mg | Freq: Two times a day (BID) | INTRAMUSCULAR | Status: AC
  Administered 2011-01-15: 4 mg via INTRAVENOUS
  Filled 2011-01-15: qty 2

## 2011-01-15 MED ORDER — ONDANSETRON HCL 4 MG/2ML IJ SOLN
4.0000 mg | Freq: Four times a day (QID) | INTRAMUSCULAR | Status: DC | PRN
  Administered 2011-01-15: 4 mg via INTRAVENOUS

## 2011-01-15 MED ORDER — ONDANSETRON HCL 4 MG/2ML IJ SOLN: 4.0000 mg | Freq: Two times a day (BID) | INTRAMUSCULAR | Status: DC

## 2011-01-15 MED ORDER — ONDANSETRON HCL 4 MG PO TABS
4.0000 mg | ORAL_TABLET | Freq: Two times a day (BID) | ORAL | Status: AC
  Administered 2011-01-16 (×2): 4 mg via ORAL
  Filled 2011-01-15 (×4): qty 1

## 2011-01-15 MED ORDER — ONDANSETRON HCL 4 MG PO TABS: 4.0000 mg | ORAL_TABLET | Freq: Two times a day (BID) | ORAL | Status: DC

## 2011-01-15 MED ORDER — PANTOPRAZOLE SODIUM 40 MG PO TBEC
40.0000 mg | DELAYED_RELEASE_TABLET | Freq: Every day | ORAL | Status: DC
  Administered 2011-01-16: 40 mg via ORAL
  Filled 2011-01-15: qty 1

## 2011-01-15 MED ORDER — IBUPROFEN 600 MG PO TABS
600.0000 mg | ORAL_TABLET | Freq: Three times a day (TID) | ORAL | Status: DC
  Administered 2011-01-15 – 2011-01-17 (×7): 600 mg via ORAL
  Filled 2011-01-15 (×9): qty 1

## 2011-01-15 NOTE — Progress Notes (Signed)
Subjective: Patient still complaining of pain more on the right side than on the left.  She relates she is not able to eat 2/2 to nausea.  Objective: Filed Vitals:   01/14/11 1522 01/14/11 1544 01/14/11 2145 01/15/11 0622  BP: 125/56  101/46 161/58  Pulse: 75  74 87  Temp: 99.5 F (37.5 C)  98.9 F (37.2 C) 100 F (37.8 C)  TempSrc:   Oral Oral  Resp: 18  18 20   Height:      Weight:      SpO2: 89% 93% 94% 93%   Weight change:   Intake/Output Summary (Last 24 hours) at 01/15/11 0855 Last data filed at 01/15/11 0454  Gross per 24 hour  Intake 2760.75 ml  Output    853 ml  Net 1907.75 ml    General: Alert, awake, oriented x3, in no acute distress.  HEENT: No bruits, no goiter. Bilateral parotid enlargement with deviation of her uvula bilaterally tender to palpation with palpable lymph nodes along the inferior aspect of both trapezius muscles. And lymph nodes tender. Heart: Regular rate and rhythm, without murmurs, rubs, gallops.  Lungs: Crackles left side, bilateral air movement.  Abdomen: Soft, nontender, nondistended, positive bowel sounds.  Neuro: Grossly intact, nonfocal.   Lab Results:  Basename 01/14/11 0600 01/13/11 1540 01/13/11 1109  NA 134* -- 133*  K 3.5 -- 3.9  CL 97 -- 95*  CO2 28 -- 28  GLUCOSE 131* -- 92  BUN 9 -- 11  CREATININE 0.71 0.59 0.63  CALCIUM 9.3 -- 9.6  MG -- -- --  PHOS -- -- --    Basename 01/13/11 1109  AST 28  ALT 12  ALKPHOS 79  BILITOT 0.5  PROT 7.7  ALBUMIN 3.1*    Basename 01/14/11 0600 01/13/11 1540 01/13/11 1109  WBC 9.9 11.0* --  NEUTROABS -- -- 7.0  HGB 12.0 13.3 --  HCT 36.7 38.7 --  MCV 94.3 93.0 --  PLT 333 329 --     Micro Results: Recent Results (from the past 240 hour(s))  CULTURE, BLOOD (ROUTINE X 2)     Status: Normal (Preliminary result)   Collection Time   01/14/11 12:18 AM      Component Value Range Status Comment   Specimen Description BLOOD RIGHT FOREARM   Final    Special Requests BOTTLES  DRAWN AEROBIC AND ANAEROBIC 0.5CC EACH   Final    Setup Time [id number]   Final    Culture     Final    Value:        BLOOD CULTURE RECEIVED NO GROWTH TO DATE CULTURE WILL BE HELD FOR 5 DAYS BEFORE ISSUING A FINAL NEGATIVE REPORT   Report Status PENDING   Incomplete   CULTURE, BLOOD (ROUTINE X 2)     Status: Normal (Preliminary result)   Collection Time   01/14/11 12:30 AM      Component Value Range Status Comment   Specimen Description BLOOD LEFT HAND   Final    Special Requests BOTTLES DRAWN AEROBIC ONLY 0.5CC   Final    Setup Time [id number]   Final    Culture     Final    Value:        BLOOD CULTURE RECEIVED NO GROWTH TO DATE CULTURE WILL BE HELD FOR 5 DAYS BEFORE ISSUING A FINAL NEGATIVE REPORT   Report Status PENDING   Incomplete     Studies/Results: Dg Chest 2 View  01/13/2011  *RADIOLOGY REPORT*  Clinical Data:  Right neck and throat swelling.  Diabetes and hypertension  CHEST - 2 VIEW  Comparison: 12/17/2010  Findings: Heart size is normal.  Both lungs are clear.  No evidence of pleural effusion.  No mass or lymphadenopathy identified.  IMPRESSION: Stable exam.  No active disease.  Original Report Authenticated By: Danae Orleans, M.D.   Ct Soft Tissue Neck W Contrast  01/13/2011  *RADIOLOGY REPORT*  Clinical Data: Bilateral neck swelling.  CT NECK WITH CONTRAST  Technique:  Multidetector CT imaging of the neck was performed with intravenous contrast.  Contrast: 75mL OMNIPAQUE IOHEXOL 300 MG/ML IV SOLN 80  Comparison: None.  Findings: There are bilateral parotid gland masses.  On the right, there are mixed solid cystic masses.  The largest mass is a complex primarily cystic mass measuring 6 cm x 4.2 cm x 4.5 cm.  On the left, the dominant nodule is mostly solid, measuring 4.8 cm x 3.8 cm x 5.8 cm.  There is a smaller solid nodule/mass along the posterior inferior margin of the right parotid gland.  Overall, the right parotid gland is larger than the left, and there is inflammation  in the adjacent fat.  Fat inflammation is significantly less adjacent to the left parotid gland.  There is inflammation adjacent to the right and left submandibular glands, greater on the right, which is felt to be from the prior glands and not from primary submandibular gland inflammation.  There are no pathologically enlarged lymph nodes, but there are multiple sub centimeter peri jugular lymph nodes more notable on the right.  The mucosal space is unremarkable with no significant asymmetry or masses.  The thyroid gland is within normal limits.  The visceral structures of the skull base are within normal limits.  There is mucosal thickening in both maxillary sinuses.  The mastoid air cells and middle ear cavities are clear.  Degenerative changes are noted throughout the cervical spine.  The lung apices show changes of mild emphysema are otherwise clear.  There are vascular calcifications at the carotid bifurcations bilaterally.  IMPRESSION: Right greater left parotid gland enlargement by cystic and solid masses.  The left parotid mass is predominately solid.  There are both primarily cystic and primarily solid nodules/masses in the right parotid gland.  There is associated inflammation more prominent on the right.  Possible etiologies include neoplastic and infectious/inflammatory disease.  Original Report Authenticated By:     Medications: I have reviewed the patient's current medications.   Principal Problem:  *Parotitis Active Problems:  Hypertension    Assessment and plan: -Currently on IV antibiotics, IV hydration and NSAID's And IV morphine which is controlling the pain. Patient has defervesce, WBC down. Will give zofran for anusea. Will continue on IV antibiotics until she is able to tolerate PO. If no improvement by tomorrow, will call ENT. -Awaiting further recommendations by ENT.   LOS: 2 days   Marinda Elk M.D. Pager: [phone] [facility] 01/15/2011, 8:55 AM

## 2011-01-16 MED ORDER — CEPHALEXIN 500 MG PO CAPS
500.0000 mg | ORAL_CAPSULE | Freq: Two times a day (BID) | ORAL | Status: DC
  Administered 2011-01-16 – 2011-01-17 (×2): 500 mg via ORAL
  Filled 2011-01-16 (×3): qty 1

## 2011-01-16 NOTE — Progress Notes (Signed)
Sonia Weeks has been in the hospital for parotid swelling. The right side his clearly worse in the left. The right when suddenly became painful and that was the reason for admission. The right when on CAT scan looks like it has a cystic appearance. The left side a solid. She states that she has had masses in her parotid region for a long time. States she has been on the antibiotics for a while in the hospital she has resolved all pain in the right side. Currently she is asymptomatic. Examination-she has very large parotid glands left side has a solid mobile palpation and the right is very firm and more diffusely swollen up to the ear lobule and posterior to the lobule. There is some soft swelling in the submandibular areas as well. No airway problems. We discussed options at this point and she preferred to proceed with a attempt at a fine-needle aspiration. This was performed after discussing the procedure risks, benefits, and options. All her questions are answered and consent was obtained. The area was on the right side was prepped with alcohol and Betadine. An 18-gauge needle was inserted twice with no material aspirated except for a very small amount of thick bloody material. He tolerated this very well. There was good hemostasis. It appears that she has bilateral parotid masses and most likely she bled into the right gland causing some surrounding infection. Currently she has resolved all her pain and now just has enlarged parotid glands. I would recommend that she be discharged to followup as an outpatient for discussion and treatment of these parotid masses. I would recommend she go home on Keflex or clindamycin since this did seem to make a difference in her clinical course and I would complete approximately a week of treatment. She will followup in our office in about 1-2 weeks.

## 2011-01-16 NOTE — Progress Notes (Signed)
[facility] progress note     Subjective: Feels well. No pain in neck or swelling now.  Has some nausea she states.  No other concerns   Objective: Vital signs in last 24 hours: Temp:  [98.2 F (36.8 C)-98.8 F (37.1 C)] 98.2 F (36.8 C) (12/28 0515) Pulse Rate:  [71-76] 76  (12/28 0515) Resp:  [18-20] 20  (12/28 0515) BP: (127-132)/(54-57) 127/57 mmHg (12/28 0515) SpO2:  [90 %-92 %] 90 % (12/28 0515) Weight change:   Intake/Output Summary (Last 24 hours) at 01/16/11 1713 Last data filed at 01/16/11 1500  Gross per 24 hour  Intake 1777.5 ml  Output   1325 ml  Net  452.5 ml    General appearance: alert, cooperative and appears stated age Eyes: conjunctivae/corneas clear. PERRL, EOM's intact. Fundi benign. Neck: no carotid bruit, no JVD, supple, symmetrical, trachea midline, thyroid not enlarged, symmetric, no tenderness/mass/nodules and significant 10 cm swelling R neck region  Lungs: clear to auscultation bilaterally and normal percussion bilaterally Heart: regular rate and rhythm, S1, S2 normal, no murmur, click, rub or gallop Extremities: extremities normal, atraumatic, no cyanosis or edema Pulses: 2+ and symmetric  Lab Results:  Basename 01/14/11 0600  NA 134*  K 3.5  CL 97  CO2 28  GLUCOSE 131*  BUN 9  CREATININE 0.71  CALCIUM 9.3  MG --  PHOS --   No results found for this basename: AST:2,ALT:2,ALKPHOS:2,BILITOT:2,PROT:2,ALBUMIN:2 in the last 72 hours No results found for this basename: LIPASE:2,AMYLASE:2 in the last 72 hours  Basename 01/14/11 0600  WBC 9.9  NEUTROABS --  HGB 12.0  HCT 36.7  MCV 94.3  PLT 333    Micro Results: Recent Results (from the past 240 hour(s))  CULTURE, BLOOD (ROUTINE X 2)     Status: Normal (Preliminary result)   Collection Time   01/14/11 12:18 AM      Component Value Range Status Comment   Specimen Description BLOOD RIGHT FOREARM   Final    Special Requests BOTTLES DRAWN AEROBIC AND ANAEROBIC 0.5CC  EACH   Final    Setup Time [id number]   Final    Culture     Final    Value:        BLOOD CULTURE RECEIVED NO GROWTH TO DATE CULTURE WILL BE HELD FOR 5 DAYS BEFORE ISSUING A FINAL NEGATIVE REPORT   Report Status PENDING   Incomplete   CULTURE, BLOOD (ROUTINE X 2)     Status: Normal (Preliminary result)   Collection Time   01/14/11 12:30 AM      Component Value Range Status Comment   Specimen Description BLOOD LEFT HAND   Final    Special Requests BOTTLES DRAWN AEROBIC ONLY 0.5CC   Final    Setup Time [id number]   Final    Culture     Final    Value:        BLOOD CULTURE RECEIVED NO GROWTH TO DATE CULTURE WILL BE HELD FOR 5 DAYS BEFORE ISSUING A FINAL NEGATIVE REPORT   Report Status PENDING   Incomplete           Medications: I have reviewed the patient's current medications. Scheduled Meds:   . ampicillin-sulbactam (UNASYN) IV  3 g Intravenous Q6H  . enoxaparin  40 mg Subcutaneous Q24H  . hydrochlorothiazide  25 mg Oral Daily  . ibuprofen  600 mg Oral TID  . nicotine  21 mg Transdermal Daily  . ondansetron (ZOFRAN) IV  4 mg Intravenous Q12H  Or  . ondansetron  4 mg Oral Q12H  . pantoprazole  40 mg Oral Q1200  . verapamil  180 mg Oral Daily   Continuous Infusions:   . dextrose 5 % and 0.45% NaCl 75 mL/hr at 01/16/11 0508   PRN Meds:.acetaminophen, acetaminophen, morphine injection, ondansetron, polyethylene glycol   Assessment/Plan: Patient Active Hospital Problem List: Parotitis (01/13/2011)   Assessment: change IV unasyn to Keflex 500 bid and reassees with ENT as outpt Hypertensi\on (01/13/2011)   Assessment: Mod controlled on Verapamil and hctz   LOS: 3 days   Sonia Weeks,JAI 01/16/2011, 5:13 PM

## 2011-01-16 NOTE — Progress Notes (Signed)
Physical Therapy Evaluation Patient Details Name: Sonia Weeks MRN: [id number] DOB: March 14, 1927 Today's Date: 01/16/2011  Problem List:  Patient Active Problem List  Diagnoses  . Parotitis  . Hypertension    Past Medical History:  Past Medical History  Diagnosis Date  . Hypertension   . Diabetes mellitus   . Arthritis    Past Surgical History:  Past Surgical History  Procedure Date  . Ectopic pregnancy surgery     PT Assessment/Plan/Recommendation PT Assessment Clinical Impression Statement: Pt presents with decreased strength, decreased balance, decreased activity tolerance and impaired gait and will benefit from skilled acute care PT to address deficits and increase functional independence. PT Recommendation/Assessment: Patient will need skilled PT in the acute care venue PT Problem List: Decreased strength;Decreased activity tolerance;Decreased balance;Decreased mobility;Decreased knowledge of use of DME PT Therapy Diagnosis : Difficulty walking;Generalized weakness PT Plan PT Frequency: Min 3X/week PT Treatment/Interventions: Gait training;DME instruction;Stair training;Functional mobility training;Therapeutic activities;Therapeutic exercise;Balance training;Patient/family education PT Recommendation Follow Up Recommendations: Home health PT PT Goals  Acute Rehab PT Goals PT Goal Formulation: With patient Pt will Transfer Bed to Chair/Chair to Bed: with modified independence Pt will Ambulate: 51 - 150 feet;with modified independence Pt will Go Up / Down Stairs: 3-5 stairs;with modified independence  PT Evaluation Precautions/Restrictions  Precautions Precautions: Fall Prior Functioning  Home Living Lives With: Alone Receives Help From: Family Type of Home: House Home Layout: One level Home Access: Stairs to enter Entrance Stairs-Rails: Right Entrance Stairs-Number of Steps: 4 Home Adaptive Equipment: Walker - rolling Prior Function Level of  Independence: Independent with basic ADLs;Independent with transfers;Independent with homemaking with ambulation;Independent with gait Driving: No Cognition Cognition Arousal/Alertness: Awake/alert Overall Cognitive Status: Appears within functional limits for tasks assessed Sensation/Coordination Sensation Light Touch: Appears Intact Proprioception: Appears Intact Extremity Assessment RLE Assessment RLE Assessment: Within Functional Limits LLE Assessment LLE Assessment: Within Functional Limits Mobility (including Balance) Bed Mobility Bed Mobility: No Transfers Transfers: Yes Sit to Stand: 6: Modified independent (Device/Increase time) Stand to Sit: 6: Modified independent (Device/Increase time) Stand Pivot Transfers: 4: Min Actuary Details (indicate cue type and reason): cues for safety, slight LOB when turning Ambulation/Gait Ambulation/Gait: Yes Ambulation/Gait Assistance: 4: Min assist Ambulation/Gait Assistance Details (indicate cue type and reason): gait initially wtih HHA, min A due to pt with several LOB requiring min A to correct.  Pt gait pushing IV pole continuing to require min A to steady especially on turns.  Gait with RW with improved balance, min A for turns and mobility in tight spaces Ambulation Distance (Feet): 100 Feet Assistive device: Rolling walker Gait Pattern: Decreased stride length (narrow BOS) Gait velocity: decreased Stairs: No Wheelchair Mobility Wheelchair Mobility: No  Balance Balance Assessed: Yes Static Standing Balance Static Standing - Level of Assistance: 4: Min assist (with functional activity) Dynamic Standing Balance Dynamic Standing - Level of Assistance: 4: Min assist (with RW with functional activity) End of Session PT - End of Session Equipment Utilized During Treatment: Gait belt Activity Tolerance: Patient tolerated treatment well Patient left: in bed;with call bell in reach;with family/visitor  present Nurse Communication: Mobility status for ambulation;Mobility status for transfers General Behavior During Session: [facility] for tasks performed Cognition: [facility] for tasks performed Pt/Daughter educated on recommendation to use RW at all times at hospital and home for increased safety and balance and to have supervision at all times when walking or transferring.  Both verbalize understanding Karsten Howry 01/16/2011, 1:40 PM

## 2011-01-16 NOTE — Progress Notes (Signed)
ANTIBIOTIC CONSULT NOTE - FOLLOW UP  Pharmacy Consult for Unasyn Indication: Parotitis  Pharmacist System-Based Medication Review: Anticoagulation: Enox 40 for VTE px, Hgb/Hct/Plt ok Infectious Disease: Unasyn D#4 for parotitis. Tmax/24h: 99.1, no CBC/BMET today. SCr 0.71, CrCl~50-55 ml/min based on 12/26 labs. BCx(12/26): NGTD Cardiovascular: Hx HTN. BP/24h: 110-130/40-50. On HCTZ, verapamil Endocrinology: Hx diet-controlled DM. CBG/24h: 90-130 (at goal). On no SSI Gastrointestinal / Nutrition: Regular diet. On Zofran for N/V. No hx GERD however on protonix Nephrology: SCr 0.71, CrCl~50-55 ml/min (12/26 labs) Pulmonary: Hx tobacco abuse. On nicotine patch Hematology / Oncology: Hgb/Hct slight drop, plts stable. No bleeding noted Best Practices: Enox for VTE px, home meds addressed, Weeks/u indication for PPI with no hx GERD and no PPI PTA  Assessment: 77 y.o. Weeks on Unasyn for parotitis. No CBC/BMET since 12/26 and at that time CrCl~50-55 ml/min and WBC trending down. Pt still with low-grade fever. Dose remains appropriate at this time.   Plan:  1. Continue Unasyn 3g IV every 6 hours 2. Will continue to follow renal function, culture results, LOT, and antibiotic de-escalation plans   Georgina Pillion, PharmD, BCPS Pager: [phone] 01/16/2011 12:Sonia PM     No Known Allergies  Patient Measurements: Height: 5\' 4"  (162.6 cm) Weight: 158 lb 11.7 oz (72 kg) IBW/kg (Calculated) : 54.7    Vital Signs: Temp: 98.2 Weeks (36.8 C) (12/28 0515) Temp src: Oral (12/28 0515) BP: 127/57 mmHg (12/28 0515) Pulse Rate: 76  (12/28 0515) Intake/Output from previous day: 12/27 0701 - 12/28 0700 In: 1297.5 [I.V.:1297.5] Out: 1125 [Urine:1125] Intake/Output from this shift: Total I/O In: 240 [P.O.:240] Out: -   Labs:  Basename 01/14/11 0600 01/13/11 1540  WBC 9.9 11.0*  HGB 12.0 13.3  PLT 333 329  LABCREA -- --  CREATININE 0.71 0.59   Estimated Creatinine Clearance: 53.6 ml/min (by C-G  formula based on Cr of 0.71). No results found for this basename: VANCOTROUGH:2,VANCOPEAK:2,VANCORANDOM:2,GENTTROUGH:2,GENTPEAK:2,GENTRANDOM:2,TOBRATROUGH:2,TOBRAPEAK:2,TOBRARND:2,AMIKACINPEAK:2,AMIKACINTROU:2,AMIKACIN:2, in the last 72 hours   Microbiology: Recent Results (from the past 720 hour(s))  CULTURE, BLOOD (ROUTINE X 2)     Status: Normal   Collection Time   12/17/10 10:05 PM      Component Value Range Status Comment   Specimen Description BLOOD LEFT ARM   Final    Special Requests BOTTLES DRAWN AEROBIC AND ANAEROBIC [facility]   Final    Setup Time [id number]   Final    Culture NO GROWTH 5 DAYS   Final    Report Status 12/24/2010 FINAL   Final   CULTURE, BLOOD (ROUTINE X 2)     Status: Normal   Collection Time   12/17/10 10:11 PM      Component Value Range Status Comment   Specimen Description BLOOD LEFT HAND   Final    Special Requests BOTTLES DRAWN AEROBIC AND ANAEROBIC 10CC EACH   Final    Setup Time [id number]   Final    Culture NO GROWTH 5 DAYS   Final    Report Status 12/24/2010 FINAL   Final   CULTURE, BLOOD (ROUTINE X 2)     Status: Normal (Preliminary result)   Collection Time   01/14/11 12:18 AM      Component Value Range Status Comment   Specimen Description BLOOD RIGHT FOREARM   Final    Special Requests BOTTLES DRAWN AEROBIC AND ANAEROBIC [facility] EACH   Final    Setup Time [id number]   Final    Culture     Final    Value:  BLOOD CULTURE RECEIVED NO GROWTH TO DATE CULTURE WILL BE HELD FOR 5 DAYS BEFORE ISSUING A FINAL NEGATIVE REPORT   Report Status PENDING   Incomplete   CULTURE, BLOOD (ROUTINE X 2)     Status: Normal (Preliminary result)   Collection Time   01/14/11 12:30 AM      Component Value Range Status Comment   Specimen Description BLOOD LEFT HAND   Final    Special Requests BOTTLES DRAWN AEROBIC ONLY 0.5CC   Final    Setup Time [id number]   Final    Culture     Final    Value:        BLOOD CULTURE RECEIVED NO GROWTH TO DATE CULTURE  WILL BE HELD FOR 5 DAYS BEFORE ISSUING A FINAL NEGATIVE REPORT   Report Status PENDING   Incomplete     Anti-infectives     Start     Dose/Rate Route Frequency Ordered Stop   01/13/11 1800   Ampicillin-Sulbactam (UNASYN) 3 g in sodium chloride 0.9 % 100 mL IVPB        3 g 100 mL/hr over 60 Minutes Intravenous Every 6 hours 01/13/11 1515     01/13/11 1115   Ampicillin-Sulbactam (UNASYN) 3 g in sodium chloride 0.9 % 100 mL IVPB        3 g 100 mL/hr over 60 Minutes Intravenous  Once 01/13/11 1112 01/13/11 1335

## 2011-01-17 MED ORDER — CEPHALEXIN 500 MG PO CAPS: 500.0000 mg | ORAL_CAPSULE | Freq: Two times a day (BID) | ORAL | Status: AC

## 2011-01-17 NOTE — Discharge Summary (Signed)
Physician Discharge Summary  Patient ID: Sonia Weeks MRN: [id number] DOB/AGE: 02/12/1927 77 y.o.  Admit date: 01/13/2011 Discharge date: 01/17/2011  Admission Diagnoses: Parotitis  Discharge Diagnoses:  Principal Problem:  *Parotitis Active Problems:  Hypertension   Discharged Condition: good  Hospital Course: 77 yr old aaf presented 12.26 with bilateral neck swellings found to be Parotitis-stated that approximately the prior year she has been experiencing swelling behind the angle of her mandibles that have progressively gotten worse throughout the year.However 2 prior to admit  swellingbecame acutely worse, now markedly larger and "hard as a stone."no fevers, chills, sweats, night sweats. No nausea or vomiting. No new stridor or difficulty breathing. Denies any dysphasia. + xerostomia for the past month no history of parotitis or stones in the past. Came into the emergency room because the swelling got so painful that she needed relief. Started broad spectrum Unasyn, as mild leucocytosis of WBC 11 which was narrowed to Keflex.  FNAC performed by Dr. Jearld Fenton 12.28 with informed consent, as R sided mass looked More cystic>L, which seemed more solid  Consults: ENT   Significant Diagnostic Studies: Studies Reviewed: CT reviewed. Bilateral parotid masses, right side appears fluid filled, left side more solid and inflammatory.   Treatments: IV hydration, antibiotics: Unasyn and cardiac meds: verapamil  Discharge Exam: Blood pressure 124/51, pulse 81, temperature 98.2 F (36.8 C), temperature source Oral, resp. rate 18, height 5\' 4"  (1.626 m), weight 72 kg (158 lb 11.7 oz), SpO2 89.00%. General appearance: alert, cooperative and appears stated age Eyes: conjunctivae/corneas clear. PERRL, EOM's intact. Fundi benign. Neck: no carotid bruit, no JVD, thyroid not enlarged, symmetric, no tenderness/mass/nodules and Marked bilateral Parotid gland swelling Resp: clear to auscultation  bilaterally and normal percussion bilaterally Cardio: S1, S2 normal and systolic murmur: early systolic 3/6, crescendo at 2nd right intercostal space Extremities: extremities normal, atraumatic, no cyanosis or edema Pulses: 2+ and symmetric Skin: Skin color, texture, turgor normal. No rashes or lesions  Disposition: Home or Self Care  Discharge Orders    Future Orders Please Complete By Expires   Diet - low sodium heart healthy      Increase activity slowly      No dressing needed      Call MD for:  temperature >100.4      Call MD for:  severe uncontrolled pain      Call MD for:  redness, tenderness, or signs of infection (pain, swelling, redness, odor or green/yellow discharge around incision site)      Call MD for:  persistant dizziness or light-headedness        Medication List  As of 01/17/2011  9:27 AM   START taking these medications         cephALEXin 500 MG capsule   Commonly known as: KEFLEX   Take 1 capsule (500 mg total) by mouth every 12 (twelve) hours.         CONTINUE taking these medications         albuterol 108 (90 BASE) MCG/ACT inhaler   Commonly known as: PROVENTIL HFA;VENTOLIN HFA   Inhale 2 puffs into the lungs every 4 (four) hours as needed for wheezing or shortness of breath.      hydrochlorothiazide 12.5 MG capsule   Commonly known as: MICROZIDE      verapamil 180 MG (CO) 24 hr tablet   Commonly known as: COVERA HS      VITAMIN B 12 PO      VITAMIN C PO  VITAMIN E PO          Where to get your medications    These are the prescriptions that you need to pick up.   You may get these medications from any pharmacy.         cephALEXin 500 MG capsule           Follow-up Information    Follow up with AVBUERE,EDWIN A, MD in 6 days.      Follow up with BYERS,JOHN M. Make an appointment in 10 days.   Contact information:   [facility] [address], [address] [place]  [address] [phone]          To do-Follow pathology from [facility] that was done 12.28  Signed: Henrine Hayter,JAI 01/17/2011, 9:27 AM

## 2011-01-20 LAB — CULTURE, BLOOD (ROUTINE X 2): Culture  Setup Time: 201212260431

## 2011-01-30 ENCOUNTER — Other Ambulatory Visit (HOSPITAL_COMMUNITY)
Admission: RE | Admit: 2011-01-30 | Discharge: 2011-01-30 | Disposition: A | Discharge status: Home / Self Care | Payer: Medicare Other | Source: Ambulatory Visit | Attending: Otolaryngology | Admitting: Otolaryngology

## 2011-01-30 DIAGNOSIS — R220 Localized swelling, mass and lump, head: Secondary | ICD-10-CM | POA: Insufficient documentation

## 2011-02-02 ENCOUNTER — Other Ambulatory Visit: Payer: Self-pay | Admitting: Otolaryngology

## 2011-07-16 ENCOUNTER — Other Ambulatory Visit: Payer: Self-pay | Admitting: Surgery

## 2012-04-12 ENCOUNTER — Telehealth: Payer: Self-pay | Admitting: Medical Oncology

## 2012-04-12 NOTE — Telephone Encounter (Signed)
erroneous

## 2013-10-16 ENCOUNTER — Emergency Department (HOSPITAL_COMMUNITY)
Admission: EM | Admit: 2013-10-16 | Discharge: 2013-10-16 | Disposition: A | Discharge status: Home / Self Care | Payer: Medicare Other | Attending: Emergency Medicine | Admitting: Emergency Medicine

## 2013-10-16 ENCOUNTER — Emergency Department (HOSPITAL_COMMUNITY): Payer: Medicare Other

## 2013-10-16 ENCOUNTER — Encounter (HOSPITAL_COMMUNITY): Payer: Self-pay | Admitting: Emergency Medicine

## 2013-10-16 DIAGNOSIS — R05 Cough: Secondary | ICD-10-CM | POA: Diagnosis not present

## 2013-10-16 DIAGNOSIS — E119 Type 2 diabetes mellitus without complications: Secondary | ICD-10-CM | POA: Insufficient documentation

## 2013-10-16 DIAGNOSIS — R059 Cough, unspecified: Secondary | ICD-10-CM | POA: Diagnosis not present

## 2013-10-16 DIAGNOSIS — I10 Essential (primary) hypertension: Secondary | ICD-10-CM | POA: Insufficient documentation

## 2013-10-16 DIAGNOSIS — Z79899 Other long term (current) drug therapy: Secondary | ICD-10-CM | POA: Insufficient documentation

## 2013-10-16 DIAGNOSIS — Z8739 Personal history of other diseases of the musculoskeletal system and connective tissue: Secondary | ICD-10-CM | POA: Insufficient documentation

## 2013-10-16 DIAGNOSIS — R0602 Shortness of breath: Secondary | ICD-10-CM | POA: Insufficient documentation

## 2013-10-16 DIAGNOSIS — R599 Enlarged lymph nodes, unspecified: Secondary | ICD-10-CM | POA: Insufficient documentation

## 2013-10-16 DIAGNOSIS — R109 Unspecified abdominal pain: Secondary | ICD-10-CM | POA: Insufficient documentation

## 2013-10-16 DIAGNOSIS — F17200 Nicotine dependence, unspecified, uncomplicated: Secondary | ICD-10-CM | POA: Diagnosis not present

## 2013-10-16 DIAGNOSIS — R071 Chest pain on breathing: Secondary | ICD-10-CM | POA: Diagnosis not present

## 2013-10-16 DIAGNOSIS — R591 Generalized enlarged lymph nodes: Secondary | ICD-10-CM

## 2013-10-16 LAB — CBC
HCT: 37.8 % (ref 36.0–46.0)
HEMOGLOBIN: 13 g/dL (ref 12.0–15.0)
MCH: 31.5 pg (ref 26.0–34.0)
MCHC: 34.4 g/dL (ref 30.0–36.0)
MCV: 91.5 fL (ref 78.0–100.0)
Platelets: 231 10*3/uL (ref 150–400)
RBC: 4.13 MIL/uL (ref 3.87–5.11)
RDW: 12.2 % (ref 11.5–15.5)
WBC: 7.4 10*3/uL (ref 4.0–10.5)

## 2013-10-16 LAB — I-STAT TROPONIN, ED: Troponin i, poc: 0.01 ng/mL (ref 0.00–0.08)

## 2013-10-16 LAB — LIPASE, BLOOD: Lipase: 18 U/L (ref 11–59)

## 2013-10-16 LAB — BASIC METABOLIC PANEL
Anion gap: 12 (ref 5–15)
BUN: 9 mg/dL (ref 6–23)
CO2: 26 mEq/L (ref 19–32)
Calcium: 9 mg/dL (ref 8.4–10.5)
Chloride: 101 mEq/L (ref 96–112)
Creatinine, Ser: 0.59 mg/dL (ref 0.50–1.10)
GFR calc Af Amer: >90 mL/min (ref >90)
GFR, EST NON AFRICAN AMERICAN: 82 mL/min — AB (ref >90)
Glucose, Bld: 138 mg/dL — ABNORMAL HIGH (ref 70–99)
POTASSIUM: 3.3 meq/L — AB (ref 3.7–5.3)
SODIUM: 139 meq/L (ref 137–147)

## 2013-10-16 LAB — PROTIME-INR
INR: 1.05 (ref 0.00–1.49)
Prothrombin Time: 13.7 seconds (ref 11.6–15.2)

## 2013-10-16 LAB — PRO B NATRIURETIC PEPTIDE: PRO B NATRI PEPTIDE: 1161 pg/mL — AB (ref 0–450)

## 2013-10-16 MED ORDER — IOHEXOL 300 MG/ML  SOLN
25.0000 mL | Freq: Once | INTRAMUSCULAR | Status: AC | PRN
  Administered 2013-10-16: 25 mL via ORAL

## 2013-10-16 MED ORDER — HYDROCODONE-ACETAMINOPHEN 5-325 MG PO TABS: 1.0000 | ORAL_TABLET | Freq: Four times a day (QID) | ORAL | Status: DC | PRN

## 2013-10-16 MED ORDER — IOHEXOL 300 MG/ML  SOLN
100.0000 mL | Freq: Once | INTRAMUSCULAR | Status: AC | PRN
  Administered 2013-10-16: 100 mL via INTRAVENOUS

## 2013-10-16 NOTE — ED Provider Notes (Signed)
CSN: [id number]     Arrival date & time 10/16/13  1229 History   First MD Initiated Contact with Patient 10/16/13 1251     Chief Complaint  Patient presents with  . Shortness of Breath     (Consider location/radiation/quality/duration/timing/severity/associated sxs/prior Treatment) Patient is a 80 y.o. female presenting with chest pain. The history is provided by the patient.  Chest Pain Pain location:  Substernal area Pain quality: pressure   Pain radiates to:  Does not radiate Pain radiates to the back: no   Pain severity:  Moderate Onset quality:  Gradual Duration:  10 days Timing:  Constant Progression:  Worsening Chronicity:  New Context: not breathing, not lifting and not at rest   Relieved by:  Nothing Worsened by:  Nothing tried Associated symptoms: abdominal pain, cough and shortness of breath   Associated symptoms: no fever     Past Medical History  Diagnosis Date  . Hypertension   . Diabetes mellitus   . Arthritis    Past Surgical History  Procedure Laterality Date  . Ectopic pregnancy surgery     History reviewed. No pertinent family history. History  Substance Use Topics  . Smoking status: Current Every Day Smoker -- 0.25 packs/day  . Smokeless tobacco: Not on file  . Alcohol Use: No   OB History   Grav Para Term Preterm Abortions TAB SAB Ect Mult Living                 Review of Systems  Constitutional: Negative for fever.  Respiratory: Positive for cough and shortness of breath.   Cardiovascular: Positive for chest pain.  Gastrointestinal: Positive for abdominal pain.  All other systems reviewed and are negative.     Allergies  Review of patient's allergies indicates no known allergies.  Home Medications   Prior to Admission medications   Medication Sig Start Date End Date Taking? Authorizing Provider  albuterol (PROVENTIL HFA;VENTOLIN HFA) 108 (90 BASE) MCG/ACT inhaler Inhale 2 puffs into the lungs every 4 (four) hours as needed for  wheezing or shortness of breath. 12/18/10 12/18/11  Charlena Cross, MD  Ascorbic Acid (VITAMIN C PO) Take 1 tablet by mouth daily.      Historical Provider, MD  Cyanocobalamin (VITAMIN B 12 PO) Take by mouth.      Historical Provider, MD  hydrochlorothiazide (MICROZIDE) 12.5 MG capsule Take 25 mg by mouth daily.     Historical Provider, MD  verapamil (COVERA HS) 180 MG (CO) 24 hr tablet Take 180 mg by mouth at bedtime.      Historical Provider, MD  VITAMIN E PO Take 1 tablet by mouth daily.      Historical Provider, MD   BP 145/79  Pulse 81  Temp(Src) 98.6 F (37 C) (Oral)  Resp 16  SpO2 94% Physical Exam  Nursing note and vitals reviewed. Constitutional: She is oriented to person, place, and time. She appears well-developed and well-nourished. No distress.  HENT:  Head: Normocephalic and atraumatic.  Mouth/Throat: Oropharynx is clear and moist.  Eyes: EOM are normal. Pupils are equal, round, and reactive to light.  Neck: Normal range of motion. Neck supple.  Cardiovascular: Normal rate and regular rhythm.  Exam reveals no friction rub.   No murmur heard. Pulmonary/Chest: Effort normal and breath sounds normal. No respiratory distress. She has no wheezes. She has no rales.  Abdominal: Soft. She exhibits no distension and no mass. There is tenderness (suprapubic). There is no rebound and no guarding.  Musculoskeletal:  Normal range of motion. She exhibits no edema.  Neurological: She is alert and oriented to person, place, and time. No cranial nerve deficit. She exhibits normal muscle tone. Coordination normal.  Skin: Skin is warm. No rash noted. She is not diaphoretic. No pallor.    ED Course  Procedures (including critical care time) Labs Review Labs Reviewed  [place], ED    Imaging Review Dg Chest 2 View  10/16/2013   CLINICAL DATA:  Shortness of breath and chest tightness for 1 week also  abdominal pain and diarrhea; history of tobacco use, hypertension, and diabetes.  EXAM: CHEST  2 VIEW  COMPARISON:  PA and lateral chest of January 13, 2011  FINDINGS: There is an abnormal masslike density in the left perihilar region new since the previous study. The lungs are otherwise adequately inflated and clear. The heart and pulmonary vascularity are normal. There is degenerative change of the left shoulder. There is curvature of the thoracolumbar spine with degenerative disc change at multiple levels.  IMPRESSION: There is an abnormal masslike density in the left perihilar region. Chest CT scanning now is recommended.  These results will be called to the ordering clinician or representative by the Radiologist Assistant, and communication documented in the PACS or zVision Dashboard.   Electronically Signed   By: David  Martinique   On: 10/16/2013 13:53   Ct Chest W Contrast  10/16/2013   CLINICAL DATA:  Diffuse abdominal pain. Low back pain. Diarrhea and shortness of breath. Symptoms for 1 week.  EXAM: CT CHEST, ABDOMEN, AND PELVIS WITH CONTRAST  TECHNIQUE: Multidetector CT imaging of the chest, abdomen and pelvis was performed following the standard protocol during bolus administration of intravenous contrast.  CONTRAST:  100 mL OMNIPAQUE IOHEXOL 300 MG/ML  SOLN  COMPARISON:  PA and lateral chest earlier this same day.  FINDINGS: CT CHEST FINDINGS  There is lymphadenopathy in the chest. Index left axillary node measures 1.8 x 1.0 cm on image 33. A precarinal node on image 27 measures 2.7 x 2.4 cm. Right infrahilar node measures 1.4 x 1.6 cm on image 34. AP window node on image 25 measures 2.4 x 1.7 cm. There is no pleural or pericardial effusion. Calcific aortic and coronary atherosclerosis is identified.  Lungs demonstrate emphysematous disease. A left upper lobe mass in the hilum measures 2.7 x 3.2 cm on image 28. A 0.3 cm right middle lobe nodule is seen on image 30. Subpleural nodule also in the right  middle lobe on images 36 measures 0.8 cm. A nodule in the right lung base which appears to be contiguous with the pleura measures 1.3 x 0.8 cm on image 44. Subtle nodularity along lymphatics about the hila may represent lymphangitic tumor spread. No focal bony abnormality is identified.  CT ABDOMEN AND PELVIS FINDINGS  There is a hypoattenuating lesion in the right hepatic lobe measuring 1.7 cm on image 51. A second hypoattenuating lesion measuring 0.6 cm is identified on image 66. The gallbladder and spleen are unremarkable. There are bilateral adrenal lesions. On the right, the adrenal gland measures 2.1 cm transverse by 2.5 cm AP by 2.7 cm craniocaudal. The left adrenal gland measures 1.9 cm craniocaudal by 1.6 cm transverse by 2.6 cm AP. There is a lesion in the left kidney measuring 2.2 x 1.5 cm on image 18 which is indeterminate. An indeterminate focus of decreased attenuation in the midpole left kidney measures 1.7  by 1.4 cm on image 12. Small renal cysts are noted. There is dilatation of the pancreatic duct in the pancreatic neck and proximal body. The duct in the remainder of the pancreatic body and tail is appears normal. No discrete pancreatic lesion is seen.  There is extensive lymphadenopathy in the abdomen. A necrotic celiac axis node on image 58 measures 1.5 cm short axis dimension. A necrotic node posterior to the portal vein measures 2.4 x 1.7 cm on image 64. Innumerable abnormal nodules are seen in the retroperitoneum. Index lesion along the lower pole the right kidney measures 1.1 cm on image 76. Multiple subcutaneous nodules are also seen. Index lesion on the left side of the abdomen measures 1.1 cm on image 76.  The uterus and urinary bladder appear normal. The left ovary has an abnormal appearance measuring 2.8 x 4.0 cm. The right ovary is also somewhat prominent measuring 1.9 x 3.0 cm. The colon is unremarkable. The stomach and small bowel appear normal. There is a fat containing midline  hernia in the pelvis. An abnormal nodule within herniated fat measures 0.6 cm on image 106. No focal bony abnormality is identified.  IMPRESSION: Findings consistent with metastatic carcinoma with lymphadenopathy in the chest and abdomen, a left hilar mass, subcutaneous nodules, liver and adrenal lesions. The primary lesion is not discretely identified but it may be the patient's left hilar mass.  Two indeterminate left renal lesions have an appearance worrisome for carcinoma either primary or metastatic.  Pancreatic ductal dilatation in the proximal body and neck without discrete pancreatic lesion identified. The could be an occult pancreatic neoplasm or the patient may have had some prior inflammatory process causing pancreatic ductal dilatation.  Abnormally prominent left ovary may be secondary to a cystic mass. Pelvic ultrasound could be used for further evaluation. It is doubtful this represents the patient's primary lesion.   Electronically Signed   By: Inge Rise M.D.   On: 10/16/2013 15:38   Ct Abdomen Pelvis W Contrast  10/16/2013   CLINICAL DATA:  Diffuse abdominal pain. Low back pain. Diarrhea and shortness of breath. Symptoms for 1 week.  EXAM: CT CHEST, ABDOMEN, AND PELVIS WITH CONTRAST  TECHNIQUE: Multidetector CT imaging of the chest, abdomen and pelvis was performed following the standard protocol during bolus administration of intravenous contrast.  CONTRAST:  100 mL OMNIPAQUE IOHEXOL 300 MG/ML  SOLN  COMPARISON:  PA and lateral chest earlier this same day.  FINDINGS: CT CHEST FINDINGS  There is lymphadenopathy in the chest. Index left axillary node measures 1.8 x 1.0 cm on image 33. A precarinal node on image 27 measures 2.7 x 2.4 cm. Right infrahilar node measures 1.4 x 1.6 cm on image 34. AP window node on image 25 measures 2.4 x 1.7 cm. There is no pleural or pericardial effusion. Calcific aortic and coronary atherosclerosis is identified.  Lungs demonstrate emphysematous disease. A  left upper lobe mass in the hilum measures 2.7 x 3.2 cm on image 28. A 0.3 cm right middle lobe nodule is seen on image 30. Subpleural nodule also in the right middle lobe on images 36 measures 0.8 cm. A nodule in the right lung base which appears to be contiguous with the pleura measures 1.3 x 0.8 cm on image 44. Subtle nodularity along lymphatics about the hila may represent lymphangitic tumor spread. No focal bony abnormality is identified.  CT ABDOMEN AND PELVIS FINDINGS  There is a hypoattenuating lesion in the right hepatic lobe measuring 1.7 cm on  image 51. A second hypoattenuating lesion measuring 0.6 cm is identified on image 66. The gallbladder and spleen are unremarkable. There are bilateral adrenal lesions. On the right, the adrenal gland measures 2.1 cm transverse by 2.5 cm AP by 2.7 cm craniocaudal. The left adrenal gland measures 1.9 cm craniocaudal by 1.6 cm transverse by 2.6 cm AP. There is a lesion in the left kidney measuring 2.2 x 1.5 cm on image 18 which is indeterminate. An indeterminate focus of decreased attenuation in the midpole left kidney measures 1.7 by 1.4 cm on image 12. Small renal cysts are noted. There is dilatation of the pancreatic duct in the pancreatic neck and proximal body. The duct in the remainder of the pancreatic body and tail is appears normal. No discrete pancreatic lesion is seen.  There is extensive lymphadenopathy in the abdomen. A necrotic celiac axis node on image 58 measures 1.5 cm short axis dimension. A necrotic node posterior to the portal vein measures 2.4 x 1.7 cm on image 64. Innumerable abnormal nodules are seen in the retroperitoneum. Index lesion along the lower pole the right kidney measures 1.1 cm on image 76. Multiple subcutaneous nodules are also seen. Index lesion on the left side of the abdomen measures 1.1 cm on image 76.  The uterus and urinary bladder appear normal. The left ovary has an abnormal appearance measuring 2.8 x 4.0 cm. The right ovary  is also somewhat prominent measuring 1.9 x 3.0 cm. The colon is unremarkable. The stomach and small bowel appear normal. There is a fat containing midline hernia in the pelvis. An abnormal nodule within herniated fat measures 0.6 cm on image 106. No focal bony abnormality is identified.  IMPRESSION: Findings consistent with metastatic carcinoma with lymphadenopathy in the chest and abdomen, a left hilar mass, subcutaneous nodules, liver and adrenal lesions. The primary lesion is not discretely identified but it may be the patient's left hilar mass.  Two indeterminate left renal lesions have an appearance worrisome for carcinoma either primary or metastatic.  Pancreatic ductal dilatation in the proximal body and neck without discrete pancreatic lesion identified. The could be an occult pancreatic neoplasm or the patient may have had some prior inflammatory process causing pancreatic ductal dilatation.  Abnormally prominent left ovary may be secondary to a cystic mass. Pelvic ultrasound could be used for further evaluation. It is doubtful this represents the patient's primary lesion.   Electronically Signed   By: Inge Rise M.D.   On: 10/16/2013 15:38     EKG Interpretation   Date/Time:  Monday October 16 2013 12:38:32 EDT Ventricular Rate:  82 PR Interval:  132 QRS Duration: 98 QT Interval:  452 QTC Calculation: 528 R Axis:   57 Text Interpretation:  Normal sinus rhythm Left ventricular hypertrophy  with repolarization abnormality Prolonged QT Abnormal ECG No prior  Confirmed by Mingo Amber  MD, Kwane Rohl ([id number]) on 10/16/2013 12:55:08 PM      MDM   Final diagnoses:  Diffuse lymphadenopathy    3F presents with lower abdominal pain, chest pain, SOB for past 10 days. Not seen by her regular doctor, Dr. Ricke Hey. No fevers. Denies SOB, fever, vomiting, diarrhea. On exam, suprapubic tenderness. Lungs clear. EKG ok. Will pursue broad based workup. CXR shows L hilar lymphadenopathy. CT of  Chest and Abdomen with diffuse lymphadenopathy concerning for metastatic disease without identified primary. Patient does not want to come in. I spoke with her PCP who asked me to encourage her to come in, but she blatantly  refused. I encouraged her to f/u as an outpatient.  I have reviewed all labs and imaging and considered them in my medical decision making.      Evelina Bucy, MD 10/16/13 [phone]

## 2013-10-16 NOTE — ED Notes (Addendum)
Pt sts she has been having SOB, chest tightness for over 1 week. Sent here by her doctor. sts also she has been having some abdominal pain and diarrhea.

## 2013-10-16 NOTE — Discharge Instructions (Signed)
Please follow up with Dr. Alyson Ingles for your diffuse lymphadenopathy, as this finding could be related to cancer.  Lymphadenopathy Lymphadenopathy means "disease of the lymph glands." But the term is usually used to describe swollen or enlarged lymph glands, also called lymph nodes. These are the bean-shaped organs found in many locations including the neck, underarm, and groin. Lymph glands are part of the immune system, which fights infections in your body. Lymphadenopathy can occur in just one area of the body, such as the neck, or it can be generalized, with lymph node enlargement in several areas. The nodes found in the neck are the most common sites of lymphadenopathy. CAUSES When your immune system responds to germs (such as viruses or bacteria ), infection-fighting cells and fluid build up. This causes the glands to grow in size. Usually, this is not something to worry about. Sometimes, the glands themselves can become infected and inflamed. This is called lymphadenitis. Enlarged lymph nodes can be caused by many diseases:  Bacterial disease, such as strep throat or a skin infection.  Viral disease, such as a common cold.  Other germs, such as Lyme disease, tuberculosis, or sexually transmitted diseases.  Cancers, such as lymphoma (cancer of the lymphatic system) or leukemia (cancer of the white blood cells).  Inflammatory diseases such as lupus or rheumatoid arthritis.  Reactions to medications. Many of the diseases above are rare, but important. This is why you should see your caregiver if you have lymphadenopathy. SYMPTOMS  Swollen, enlarged lumps in the neck, back of the head, or other locations.  Tenderness.  Warmth or redness of the skin over the lymph nodes.  Fever. DIAGNOSIS Enlarged lymph nodes are often near the source of infection. They can help health care providers diagnose your illness. For instance:  Swollen lymph nodes around the jaw might be caused by an  infection in the mouth.  Enlarged glands in the neck often signal a throat infection.  Lymph nodes that are swollen in more than one area often indicate an illness caused by a virus. Your caregiver will likely know what is causing your lymphadenopathy after listening to your history and examining you. Blood tests, x-rays, or other tests may be needed. If the cause of the enlarged lymph node cannot be found, and it does not go away by itself, then a biopsy may be needed. Your caregiver will discuss this with you. TREATMENT Treatment for your enlarged lymph nodes will depend on the cause. Many times the nodes will shrink to normal size by themselves, with no treatment. Antibiotics or other medicines may be needed for infection. Only take over-the-counter or prescription medicines for pain, discomfort, or fever as directed by your caregiver. HOME CARE INSTRUCTIONS Swollen lymph glands usually return to normal when the underlying medical condition goes away. If they persist, contact your health-care provider. He/she might prescribe antibiotics or other treatments, depending on the diagnosis. Take any medications exactly as prescribed. Keep any follow-up appointments made to check on the condition of your enlarged nodes. SEEK MEDICAL CARE IF:  Swelling lasts for more than two weeks.  You have symptoms such as weight loss, night sweats, fatigue, or fever that does not go away.  The lymph nodes are hard, seem fixed to the skin, or are growing rapidly.  Skin over the lymph nodes is red and inflamed. This could mean there is an infection. SEEK IMMEDIATE MEDICAL CARE IF:  Fluid starts leaking from the area of the enlarged lymph node.  You develop a  fever of 102 F (38.9 C) or greater.  Severe pain develops (not necessarily at the site of a large lymph node).  You develop chest pain or shortness of breath.  You develop worsening abdominal pain. MAKE SURE YOU:  Understand these  instructions.  Will watch your condition.  Will get help right away if you are not doing well or get worse. Document Released: 10/15/2007 Document Revised: 05/22/2013 Document Reviewed: 10/15/2007 [facility] Patient Information 2015 [place], [place]. This information is not intended to replace advice given to you by your health care provider. Make sure you discuss any questions you have with your health care provider.

## 2013-10-16 NOTE — ED Notes (Signed)
Returned from ct scan 

## 2013-10-16 NOTE — ED Notes (Signed)
Pt states that she has become more short of breath and is unable to walk without becoming short of breath.

## 2013-10-23 ENCOUNTER — Telehealth: Payer: Self-pay | Admitting: *Deleted

## 2013-10-23 NOTE — Telephone Encounter (Signed)
Called pt unable to reach.  Called referring office.  They were unsure of phone number to call.  Dr. Alyson Ingles asked that I call back.

## 2013-10-24 ENCOUNTER — Telehealth: Payer: Self-pay | Admitting: *Deleted

## 2013-10-24 NOTE — Telephone Encounter (Signed)
Called daughter with appt.  She verbalized understanding of appt time and place.

## 2013-10-26 ENCOUNTER — Ambulatory Visit (HOSPITAL_BASED_OUTPATIENT_CLINIC_OR_DEPARTMENT_OTHER): Payer: Medicare Other | Admitting: Internal Medicine

## 2013-10-26 ENCOUNTER — Encounter: Payer: Self-pay | Admitting: *Deleted

## 2013-10-26 ENCOUNTER — Encounter: Payer: Self-pay | Admitting: Internal Medicine

## 2013-10-26 ENCOUNTER — Telehealth: Payer: Self-pay | Admitting: Internal Medicine

## 2013-10-26 ENCOUNTER — Ambulatory Visit: Payer: Medicare Other | Attending: Internal Medicine | Admitting: Physical Therapy

## 2013-10-26 VITALS — BP 142/50 | HR 66 | Temp 99.0°F | Resp 17 | Ht 64.0 in | Wt 128.4 lb

## 2013-10-26 DIAGNOSIS — Z5189 Encounter for other specified aftercare: Secondary | ICD-10-CM | POA: Diagnosis not present

## 2013-10-26 DIAGNOSIS — I10 Essential (primary) hypertension: Secondary | ICD-10-CM

## 2013-10-26 DIAGNOSIS — C3490 Malignant neoplasm of unspecified part of unspecified bronchus or lung: Secondary | ICD-10-CM | POA: Insufficient documentation

## 2013-10-26 DIAGNOSIS — E119 Type 2 diabetes mellitus without complications: Secondary | ICD-10-CM | POA: Insufficient documentation

## 2013-10-26 DIAGNOSIS — R229 Localized swelling, mass and lump, unspecified: Secondary | ICD-10-CM

## 2013-10-26 DIAGNOSIS — C801 Malignant (primary) neoplasm, unspecified: Secondary | ICD-10-CM

## 2013-10-26 DIAGNOSIS — C799 Secondary malignant neoplasm of unspecified site: Secondary | ICD-10-CM

## 2013-10-26 DIAGNOSIS — R262 Difficulty in walking, not elsewhere classified: Secondary | ICD-10-CM | POA: Diagnosis not present

## 2013-10-26 HISTORY — DX: Secondary malignant neoplasm of unspecified site: C79.9

## 2013-10-26 NOTE — Progress Notes (Signed)
[place] Telephone:([phone]) [phone]   [phone] Thoracic clinic CONSULT NOTE  REFERRING PHYSICIAN: Dr. Laren Everts  REASON FOR CONSULTATION:  80 years old African American female with questionable metastatic cancer.  HPI Sonia Weeks is a 80 y.o. female with heavy smoking history and past medical history significant for hypertension, diabetes mellitus, Parotitis and osteoarthritis. She is also legally blind in both eyes since 1972. The patient mentions that for the last 2 months she has been complaining of increasing fatigue and weakness. She was evaluated at the emergency Department at [facility]. Chest x-ray on 10/16/2013 showed an abnormal masslike density in the left perihilar region. This was followed by CT scan of the chest, abdomen and pelvis which showed several findings including lymphadenopathy in the chest. Index left axillary node measures 1.8 x 1.0 cm. A precarinal node, measures 2.7 x 2.4 cm. Right infrahilar node measures 1.4 x 1.6 cm. AP window node measures 2.4 x 1.7 cm. A left upper lobe mass in the hilum measures 2.7 x 3.2 cm in addition to other small nodules in the right middle lobe and right lung base contiguous with the pleura measuring 1.3 x 0.8 CM. In the abdomen there was also hypoattenuating lesion in the right hepatic lobe measuring 1.7 CM and a second hypoattenuating lesion measuring 0.6 CM. There was also bilateral adrenal gland lesion and a lesion in the left kidney measuring 2.2 x 1.5 CM. There was also extensive lymphadenopathy in the abdomen. The left ovary has an abnormal appearance measuring 2.8 x 4.0 cm. The right ovary is also somewhat prominent measuring 1.9 x 3.0 cm. The colon is unremarkable.  The patient was referred to me today for evaluation and recommendation regarding her condition. When seen today she complains about inability to walk as well as weight loss and lack of appetite. She lost around 20 pounds  recently She also has pain on the back and hip as well as shortness of breath with exertion. She denied having any headache or visual changes. Her family history significant for a mother who died from old age. She had 2 sister and father with diabetes mellitus. The patient is a widow and has one daughter, Sonia Weeks. She has a history of smoking one pack per day for around 7 years and unfortunately she continues to smoke. I strongly advised her to quit smoking and offered her smoke cessation program. The patient also has no history of alcohol or drug abuse.  HPI  Past Medical History  Diagnosis Date  . Hypertension   . Diabetes mellitus   . Arthritis   . Metastatic cancer 10/26/2013    Past Surgical History  Procedure Laterality Date  . Ectopic pregnancy surgery      History reviewed. No pertinent family history.  Social History History  Substance Use Topics  . Smoking status: Current Every Day Smoker -- 0.25 packs/day  . Smokeless tobacco: Not on file  . Alcohol Use: No    No Known Allergies  Current Outpatient Prescriptions  Medication Sig Dispense Refill  . acetaminophen (TYLENOL) 500 MG tablet Take 1,000 mg by mouth every 6 (six) hours as needed for moderate pain.      Marland Kitchen albuterol (PROVENTIL HFA;VENTOLIN HFA) 108 (90 BASE) MCG/ACT inhaler Inhale 2 puffs into the lungs every 6 (six) hours as needed for wheezing or shortness of breath.      . cetirizine (ZYRTEC) 10 MG tablet Take 10 mg by mouth daily.      Marland Kitchen  hydrochlorothiazide (MICROZIDE) 12.5 MG capsule Take 25 mg by mouth daily.       Marland Kitchen HYDROcodone-acetaminophen (NORCO/VICODIN) 5-325 MG per tablet Take 1 tablet by mouth every 6 (six) hours as needed for moderate pain.  20 tablet  0  . naproxen (NAPROSYN) 500 MG tablet Take 500 mg by mouth 2 (two) times daily as needed for moderate pain.      . verapamil (COVERA HS) 180 MG (CO) 24 hr tablet Take 180 mg by mouth at bedtime.        Marland Kitchen albuterol (PROVENTIL HFA;VENTOLIN HFA)  108 (90 BASE) MCG/ACT inhaler Inhale 2 puffs into the lungs every 4 (four) hours as needed for wheezing or shortness of breath.  1 Inhaler  0   No current facility-administered medications for this visit.    Review of Systems  Constitutional: positive for anorexia, fatigue and weight loss Eyes: negative Ears, nose, mouth, throat, and face: negative Respiratory: positive for dyspnea on exertion Cardiovascular: negative Gastrointestinal: negative Genitourinary:negative Integument/breast: negative Hematologic/lymphatic: negative Musculoskeletal:positive for muscle weakness Neurological: negative Behavioral/Psych: negative Endocrine: negative Allergic/Immunologic: negative  Physical Exam  VVO:HYWVP, healthy, no distress, well nourished and well developed SKIN: skin color, texture, turgor are normal, no rashes or significant lesions HEAD: Normocephalic, No masses, lesions, tenderness or abnormalities EYES: normal, PERRLA EARS: External ears normal, Canals clear OROPHARYNX:no exudate, no erythema and lips, buccal mucosa, and tongue normal  NECK: supple, no adenopathy, no JVD LYMPH:  no palpable lymphadenopathy, no hepatosplenomegaly BREAST:not examined LUNGS: clear to auscultation , and palpation HEART: regular rate & rhythm, no murmurs and no gallops ABDOMEN:abdomen soft, non-tender, normal bowel sounds and no masses or organomegaly BACK: Back symmetric, no curvature., No CVA tenderness EXTREMITIES:no joint deformities, effusion, or inflammation, no edema, no skin discoloration  NEURO: alert & oriented x 3 with fluent speech, no focal motor/sensory deficits  PERFORMANCE STATUS: ECOG 2  LABORATORY DATA: Lab Results  Component Value Date   WBC 7.4 10/16/2013   HGB 13.0 10/16/2013   HCT 37.8 10/16/2013   MCV 91.5 10/16/2013   PLT 231 10/16/2013      Chemistry      Component Value Date/Time   NA 139 10/16/2013 1320   K 3.3* 10/16/2013 1320   CL 101 10/16/2013 1320   CO2 26  10/16/2013 1320   BUN 9 10/16/2013 1320   CREATININE 0.59 10/16/2013 1320      Component Value Date/Time   CALCIUM 9.0 10/16/2013 1320   ALKPHOS 79 01/13/2011 1109   AST 28 01/13/2011 1109   ALT 12 01/13/2011 1109   BILITOT 0.5 01/13/2011 1109       RADIOGRAPHIC STUDIES: Dg Chest 2 View  10/16/2013   CLINICAL DATA:  Shortness of breath and chest tightness for 1 week also abdominal pain and diarrhea; history of tobacco use, hypertension, and diabetes.  EXAM: CHEST  2 VIEW  COMPARISON:  PA and lateral chest of January 13, 2011  FINDINGS: There is an abnormal masslike density in the left perihilar region new since the previous study. The lungs are otherwise adequately inflated and clear. The heart and pulmonary vascularity are normal. There is degenerative change of the left shoulder. There is curvature of the thoracolumbar spine with degenerative disc change at multiple levels.  IMPRESSION: There is an abnormal masslike density in the left perihilar region. Chest CT scanning now is recommended.  These results will be called to the ordering clinician or representative by the Radiologist Assistant, and communication documented in the PACS or zVision  Dashboard.   Electronically Signed   By: David  Martinique   On: 10/16/2013 13:53   Ct Chest W Contrast  10/16/2013   CLINICAL DATA:  Diffuse abdominal pain. Low back pain. Diarrhea and shortness of breath. Symptoms for 1 week.  EXAM: CT CHEST, ABDOMEN, AND PELVIS WITH CONTRAST  TECHNIQUE: Multidetector CT imaging of the chest, abdomen and pelvis was performed following the standard protocol during bolus administration of intravenous contrast.  CONTRAST:  100 mL OMNIPAQUE IOHEXOL 300 MG/ML  SOLN  COMPARISON:  PA and lateral chest earlier this same day.  FINDINGS: CT CHEST FINDINGS  There is lymphadenopathy in the chest. Index left axillary node measures 1.8 x 1.0 cm on image 33. A precarinal node on image 27 measures 2.7 x 2.4 cm. Right infrahilar node measures  1.4 x 1.6 cm on image 34. AP window node on image 25 measures 2.4 x 1.7 cm. There is no pleural or pericardial effusion. Calcific aortic and coronary atherosclerosis is identified.  Lungs demonstrate emphysematous disease. A left upper lobe mass in the hilum measures 2.7 x 3.2 cm on image 28. A 0.3 cm right middle lobe nodule is seen on image 30. Subpleural nodule also in the right middle lobe on images 36 measures 0.8 cm. A nodule in the right lung base which appears to be contiguous with the pleura measures 1.3 x 0.8 cm on image 44. Subtle nodularity along lymphatics about the hila may represent lymphangitic tumor spread. No focal bony abnormality is identified.  CT ABDOMEN AND PELVIS FINDINGS  There is a hypoattenuating lesion in the right hepatic lobe measuring 1.7 cm on image 51. A second hypoattenuating lesion measuring 0.6 cm is identified on image 66. The gallbladder and spleen are unremarkable. There are bilateral adrenal lesions. On the right, the adrenal gland measures 2.1 cm transverse by 2.5 cm AP by 2.7 cm craniocaudal. The left adrenal gland measures 1.9 cm craniocaudal by 1.6 cm transverse by 2.6 cm AP. There is a lesion in the left kidney measuring 2.2 x 1.5 cm on image 18 which is indeterminate. An indeterminate focus of decreased attenuation in the midpole left kidney measures 1.7 by 1.4 cm on image 12. Small renal cysts are noted. There is dilatation of the pancreatic duct in the pancreatic neck and proximal body. The duct in the remainder of the pancreatic body and tail is appears normal. No discrete pancreatic lesion is seen.  There is extensive lymphadenopathy in the abdomen. A necrotic celiac axis node on image 58 measures 1.5 cm short axis dimension. A necrotic node posterior to the portal vein measures 2.4 x 1.7 cm on image 64. Innumerable abnormal nodules are seen in the retroperitoneum. Index lesion along the lower pole the right kidney measures 1.1 cm on image 76. Multiple subcutaneous  nodules are also seen. Index lesion on the left side of the abdomen measures 1.1 cm on image 76.  The uterus and urinary bladder appear normal. The left ovary has an abnormal appearance measuring 2.8 x 4.0 cm. The right ovary is also somewhat prominent measuring 1.9 x 3.0 cm. The colon is unremarkable. The stomach and small bowel appear normal. There is a fat containing midline hernia in the pelvis. An abnormal nodule within herniated fat measures 0.6 cm on image 106. No focal bony abnormality is identified.  IMPRESSION: Findings consistent with metastatic carcinoma with lymphadenopathy in the chest and abdomen, a left hilar mass, subcutaneous nodules, liver and adrenal lesions. The primary lesion is not discretely identified  but it may be the patient's left hilar mass.  Two indeterminate left renal lesions have an appearance worrisome for carcinoma either primary or metastatic.  Pancreatic ductal dilatation in the proximal body and neck without discrete pancreatic lesion identified. The could be an occult pancreatic neoplasm or the patient may have had some prior inflammatory process causing pancreatic ductal dilatation.  Abnormally prominent left ovary may be secondary to a cystic mass. Pelvic ultrasound could be used for further evaluation. It is doubtful this represents the patient's primary lesion.   Electronically Signed   By: Inge Rise M.D.   On: 10/16/2013 15:38   Ct Abdomen Pelvis W Contrast  10/16/2013   CLINICAL DATA:  Diffuse abdominal pain. Low back pain. Diarrhea and shortness of breath. Symptoms for 1 week.  EXAM: CT CHEST, ABDOMEN, AND PELVIS WITH CONTRAST  TECHNIQUE: Multidetector CT imaging of the chest, abdomen and pelvis was performed following the standard protocol during bolus administration of intravenous contrast.  CONTRAST:  100 mL OMNIPAQUE IOHEXOL 300 MG/ML  SOLN  COMPARISON:  PA and lateral chest earlier this same day.  FINDINGS: CT CHEST FINDINGS  There is lymphadenopathy in  the chest. Index left axillary node measures 1.8 x 1.0 cm on image 33. A precarinal node on image 27 measures 2.7 x 2.4 cm. Right infrahilar node measures 1.4 x 1.6 cm on image 34. AP window node on image 25 measures 2.4 x 1.7 cm. There is no pleural or pericardial effusion. Calcific aortic and coronary atherosclerosis is identified.  Lungs demonstrate emphysematous disease. A left upper lobe mass in the hilum measures 2.7 x 3.2 cm on image 28. A 0.3 cm right middle lobe nodule is seen on image 30. Subpleural nodule also in the right middle lobe on images 36 measures 0.8 cm. A nodule in the right lung base which appears to be contiguous with the pleura measures 1.3 x 0.8 cm on image 44. Subtle nodularity along lymphatics about the hila may represent lymphangitic tumor spread. No focal bony abnormality is identified.  CT ABDOMEN AND PELVIS FINDINGS  There is a hypoattenuating lesion in the right hepatic lobe measuring 1.7 cm on image 51. A second hypoattenuating lesion measuring 0.6 cm is identified on image 66. The gallbladder and spleen are unremarkable. There are bilateral adrenal lesions. On the right, the adrenal gland measures 2.1 cm transverse by 2.5 cm AP by 2.7 cm craniocaudal. The left adrenal gland measures 1.9 cm craniocaudal by 1.6 cm transverse by 2.6 cm AP. There is a lesion in the left kidney measuring 2.2 x 1.5 cm on image 18 which is indeterminate. An indeterminate focus of decreased attenuation in the midpole left kidney measures 1.7 by 1.4 cm on image 12. Small renal cysts are noted. There is dilatation of the pancreatic duct in the pancreatic neck and proximal body. The duct in the remainder of the pancreatic body and tail is appears normal. No discrete pancreatic lesion is seen.  There is extensive lymphadenopathy in the abdomen. A necrotic celiac axis node on image 58 measures 1.5 cm short axis dimension. A necrotic node posterior to the portal vein measures 2.4 x 1.7 cm on image 64.  Innumerable abnormal nodules are seen in the retroperitoneum. Index lesion along the lower pole the right kidney measures 1.1 cm on image 76. Multiple subcutaneous nodules are also seen. Index lesion on the left side of the abdomen measures 1.1 cm on image 76.  The uterus and urinary bladder appear normal. The left ovary  has an abnormal appearance measuring 2.8 x 4.0 cm. The right ovary is also somewhat prominent measuring 1.9 x 3.0 cm. The colon is unremarkable. The stomach and small bowel appear normal. There is a fat containing midline hernia in the pelvis. An abnormal nodule within herniated fat measures 0.6 cm on image 106. No focal bony abnormality is identified.  IMPRESSION: Findings consistent with metastatic carcinoma with lymphadenopathy in the chest and abdomen, a left hilar mass, subcutaneous nodules, liver and adrenal lesions. The primary lesion is not discretely identified but it may be the patient's left hilar mass.  Two indeterminate left renal lesions have an appearance worrisome for carcinoma either primary or metastatic.  Pancreatic ductal dilatation in the proximal body and neck without discrete pancreatic lesion identified. The could be an occult pancreatic neoplasm or the patient may have had some prior inflammatory process causing pancreatic ductal dilatation.  Abnormally prominent left ovary may be secondary to a cystic mass. Pelvic ultrasound could be used for further evaluation. It is doubtful this represents the patient's primary lesion.   Electronically Signed   By: Inge Rise M.D.   On: 10/16/2013 15:38    ASSESSMENT: This is a very pleasant 80 years old [place] American female with widely metastatic disease of unknown primary at this point pending tissue diagnosis.   PLAN: I had a lengthy discussion with the patient and her daughter today about her current disease status and treatment options. I recommended for the patient to have one of the subcutaneous nodule excised by  Gen. surgery for tissue diagnosis. I also discussed with the patient completing the staging workup by ordering a PET scan and MRI of the brain but she would like to hold her these procedures until she knows her diagnosis, which is a very reasonable approach in this patient who may decide against any future treatment. I will refer her to Gen. surgery for evaluation and excisional biopsy of one of the subcutaneous nodules. I would see her back for followup visit in 2 weeks or sooner for evaluation and discussion of her biopsy results and treatment options. For smoke cessation, I strongly encouraged the patient to quit smoking and offered her smoke cessation program. She was also seen by the thoracic navigator for counseling regarding her smoking. She was advised to call immediately if she has any concerning symptoms in the interval.  The patient voices understanding of current disease status and treatment options and is in agreement with the current care plan.  All questions were answered. The patient knows to call the clinic with any problems, questions or concerns. We can certainly see the patient much sooner if necessary.  Thank you so much for allowing me to participate in the care of Madelia. I will continue to follow up the patient with you and assist in her care.  I spent 40 minutes counseling the patient face to face. The total time spent in the appointment was 60 minutes.  Disclaimer: This note was dictated with voice recognition software. Similar sounding words can inadvertently be transcribed and may not be corrected upon review.   Santita Hunsberger K. 10/26/2013, 5:05 PM

## 2013-10-26 NOTE — Telephone Encounter (Signed)
Pt confirmed labs/ov per 10/08 POF, advised pt I would call [facility] to schedule urgent for consultation referral, gave pt AVS..... KJ

## 2013-10-26 NOTE — Patient Instructions (Signed)
Smoking Cessation, Tips for Success  If you are ready to quit smoking, congratulations! You have chosen to help yourself be healthier. Cigarettes bring nicotine, tar, carbon monoxide, and other irritants into your body. Your lungs, heart, and blood vessels will be able to work better without these poisons. There are many different ways to quit smoking. Nicotine gum, nicotine patches, a nicotine inhaler, or nicotine nasal spray can help with physical craving. Hypnosis, support groups, and medicines help break the habit of smoking.  WHAT THINGS CAN I DO TO MAKE QUITTING EASIER?   Here are some tips to help you quit for good:  · Pick a date when you will quit smoking completely. Tell all of your friends and family about your plan to quit on that date.  · Do not try to slowly cut down on the number of cigarettes you are smoking. Pick a quit date and quit smoking completely starting on that day.  · Throw away all cigarettes.    · Clean and remove all ashtrays from your home, work, and car.  · On a card, write down your reasons for quitting. Carry the card with you and read it when you get the urge to smoke.  · Cleanse your body of nicotine. Drink enough water and fluids to keep your urine clear or pale yellow. Do this after quitting to flush the nicotine from your body.  · Learn to predict your moods. Do not let a bad situation be your excuse to have a cigarette. Some situations in your life might tempt you into wanting a cigarette.  · Never have "just one" cigarette. It leads to wanting another and another. Remind yourself of your decision to quit.  · Change habits associated with smoking. If you smoked while driving or when feeling stressed, try other activities to replace smoking. Stand up when drinking your coffee. Brush your teeth after eating. Sit in a different chair when you read the paper. Avoid alcohol while trying to quit, and try to drink fewer caffeinated beverages. Alcohol and caffeine may urge you to  smoke.  · Avoid foods and drinks that can trigger a desire to smoke, such as sugary or spicy foods and alcohol.  · Ask people who smoke not to smoke around you.  · Have something planned to do right after eating or having a cup of coffee. For example, plan to take a walk or exercise.  · Try a relaxation exercise to calm you down and decrease your stress. Remember, you may be tense and nervous for the first 2 weeks after you quit, but this will pass.  · Find new activities to keep your hands busy. Play with a pen, coin, or rubber band. Doodle or draw things on paper.  · Brush your teeth right after eating. This will help cut down on the craving for the taste of tobacco after meals. You can also try mouthwash.    · Use oral substitutes in place of cigarettes. Try using lemon drops, carrots, cinnamon sticks, or chewing gum. Keep them handy so they are available when you have the urge to smoke.  · When you have the urge to smoke, try deep breathing.  · Designate your home as a nonsmoking area.  · If you are a heavy smoker, ask your health care provider about a prescription for nicotine chewing gum. It can ease your withdrawal from nicotine.  · Reward yourself. Set aside the cigarette money you save and buy yourself something nice.  · Look for   support from others. Join a support group or smoking cessation program. Ask someone at home or at work to help you with your plan to quit smoking.  · Always ask yourself, "Do I need this cigarette or is this just a reflex?" Tell yourself, "Today, I choose not to smoke," or "I do not want to smoke." You are reminding yourself of your decision to quit.  · Do not replace cigarette smoking with electronic cigarettes (commonly called e-cigarettes). The safety of e-cigarettes is unknown, and some may contain harmful chemicals.  · If you relapse, do not give up! Plan ahead and think about what you will do the next time you get the urge to smoke.  HOW WILL I FEEL WHEN I QUIT SMOKING?  You  may have symptoms of withdrawal because your body is used to nicotine (the addictive substance in cigarettes). You may crave cigarettes, be irritable, feel very hungry, cough often, get headaches, or have difficulty concentrating. The withdrawal symptoms are only temporary. They are strongest when you first quit but will go away within 10-14 days. When withdrawal symptoms occur, stay in control. Think about your reasons for quitting. Remind yourself that these are signs that your body is healing and getting used to being without cigarettes. Remember that withdrawal symptoms are easier to treat than the major diseases that smoking can cause.   Even after the withdrawal is over, expect periodic urges to smoke. However, these cravings are generally short lived and will go away whether you smoke or not. Do not smoke!  WHAT RESOURCES ARE AVAILABLE TO HELP ME QUIT SMOKING?  Your health care provider can direct you to community resources or hospitals for support, which may include:  · Group support.  · Education.  · Hypnosis.  · Therapy.  Document Released: 10/04/2003 Document Revised: 05/22/2013 Document Reviewed: 06/23/2012  Exit[employer]® Patient Information ©2015 [employer]. This information is not intended to replace advice given to you by your health care provider. Make sure you discuss any questions you have with your health care provider.

## 2013-10-26 NOTE — Progress Notes (Signed)
Spoke with patient at thoracic clinic today.  She does not have diagnosis yet but learned today there is significant disease.  Patient understands she will have a biopsy to understand more.  No barriers identified at this time.

## 2013-10-26 NOTE — Progress Notes (Signed)
[place] Clinical Social Work  Clinical Social Work met with patient/family and Futures trader at [facility] appointment to offer support and assess for psychosocial needs.  Medical oncologist reviewed patient's diagnosis and need for biopsy, PET, and brain MRI.  Ms. Hamed was accompanied by her daughter.  She currently lives alone- she reports having no energy or appetite.  Patient is agreeable to biopsy to determine type of cancer.  Clinical Social Work briefly discussed Clinical Social Work role and [employer] support programs/services.  Clinical Social Work encouraged patient to call with any additional questions or concerns.   Polo Riley, MSW, LCSW, OSW-C Clinical Social Worker [facility] [phone]

## 2013-10-27 ENCOUNTER — Other Ambulatory Visit: Payer: Self-pay | Admitting: *Deleted

## 2013-10-27 DIAGNOSIS — C799 Secondary malignant neoplasm of unspecified site: Secondary | ICD-10-CM

## 2013-10-27 NOTE — Telephone Encounter (Signed)
Spk w/Sonya at [place] per 10/08 POF pt has been referred to [facility] for surgery. Sent msg to [facility] stating we need more information pertaining to surgery and that if this has to do with pt's breast, we will need to put an order in for MM/US then possibly biopsy. Will wait to hear from [facility] for further assistance then contact pt.... KJ

## 2013-10-30 ENCOUNTER — Telehealth: Payer: Self-pay | Admitting: *Deleted

## 2013-10-30 ENCOUNTER — Encounter: Payer: Self-pay | Admitting: *Deleted

## 2013-10-30 NOTE — [facility] Oncology Navigator Note (Unsigned)
Called [facility] and spoke with [place].  She will follow up with Dr. Barry Dienes to see who should see her.  She will call me back.

## 2013-10-30 NOTE — Telephone Encounter (Signed)
Called patient and gave appt.  Patient asked that I call the daughter with appt.  I called and gave daughter appt with Dr. Barry Dienes on 11/13/13 and Dr. Julien Nordmann on 11/16/13.  She verbalized understanding of appt time and place.

## 2013-11-06 ENCOUNTER — Encounter: Payer: Self-pay | Admitting: *Deleted

## 2013-11-06 ENCOUNTER — Other Ambulatory Visit: Payer: Self-pay | Admitting: *Deleted

## 2013-11-06 ENCOUNTER — Telehealth: Payer: Self-pay | Admitting: *Deleted

## 2013-11-06 ENCOUNTER — Other Ambulatory Visit: Payer: Self-pay | Admitting: Internal Medicine

## 2013-11-06 DIAGNOSIS — C799 Secondary malignant neoplasm of unspecified site: Secondary | ICD-10-CM

## 2013-11-06 NOTE — Progress Notes (Signed)
Called left vm message for [facility] scheduler to call and see if appt can be moved up

## 2013-11-06 NOTE — Telephone Encounter (Signed)
Patient's daughter called left vm message.  I called and left a message to call with name and phone number.

## 2013-11-07 ENCOUNTER — Encounter: Payer: Self-pay | Admitting: *Deleted

## 2013-11-07 NOTE — [facility] Oncology Navigator Note (Unsigned)
Daughter called.  She stated patient was not drinking or eating much and is very weak.  She stated she is sleeping a lot.  I spoke with Dr. Julien Nordmann and he stated patient should go to the hospital.  I relayed that to the daughter.  The daughter did state the patient did not want to go to the hospital.  I encourage the daughter take her. I called [facility] and was hung up on.  I called back left mv message to call to get patient an earlier appt

## 2013-11-07 NOTE — [facility] Oncology Navigator Note (Unsigned)
[facility] called back.  Patient is on a first to be called list if there is a cancellation.  Stephanie from [place] stated she would call me if a spot came open.

## 2013-11-08 ENCOUNTER — Other Ambulatory Visit: Payer: Medicare Other

## 2013-11-08 ENCOUNTER — Ambulatory Visit: Payer: Medicare Other | Admitting: Internal Medicine

## 2013-11-08 ENCOUNTER — Telehealth: Payer: Self-pay | Admitting: *Deleted

## 2013-11-08 NOTE — Telephone Encounter (Signed)
Called to check on patient no answer

## 2013-11-09 ENCOUNTER — Encounter: Payer: Self-pay | Admitting: *Deleted

## 2013-11-09 NOTE — [facility] Oncology Navigator Note (Unsigned)
Called and spoke with daughter.  She stated her mom has a little bit of an appetite.  She has been eating and drinking.  I reminder her of the up coming appt's.  She verbalized understanding.

## 2013-11-10 ENCOUNTER — Encounter: Payer: Self-pay | Admitting: *Deleted

## 2013-11-10 NOTE — [facility] Oncology Navigator Note (Unsigned)
Patient's daughter called and stated patient was confused and "not doing well".  I listened as she explained her mother's condition.  I asked if she was able to get her mother to the hospital as for evaluation.  She stated no.  I reinforced the importance of getting her mother to the hospital.  The daughter is out of town but will check on her mother later today.  The daughter stated she would call back Monday with an update.  I again encourage her to get mother to the hospital.

## 2013-11-12 ENCOUNTER — Emergency Department (HOSPITAL_COMMUNITY): Payer: Medicare Other

## 2013-11-12 ENCOUNTER — Encounter (HOSPITAL_COMMUNITY): Payer: Self-pay | Admitting: Emergency Medicine

## 2013-11-12 ENCOUNTER — Inpatient Hospital Stay (HOSPITAL_COMMUNITY)
Admission: EM | Admit: 2013-11-12 | Discharge: 2013-11-17 | DRG: 536 | Disposition: A | Discharge status: Skilled Nursing Facility | Payer: Medicare Other | Attending: Internal Medicine | Admitting: Internal Medicine

## 2013-11-12 ENCOUNTER — Inpatient Hospital Stay (HOSPITAL_COMMUNITY): Payer: Medicare Other

## 2013-11-12 DIAGNOSIS — Z79891 Long term (current) use of opiate analgesic: Secondary | ICD-10-CM | POA: Diagnosis not present

## 2013-11-12 DIAGNOSIS — C7989 Secondary malignant neoplasm of other specified sites: Secondary | ICD-10-CM | POA: Diagnosis not present

## 2013-11-12 DIAGNOSIS — E119 Type 2 diabetes mellitus without complications: Secondary | ICD-10-CM

## 2013-11-12 DIAGNOSIS — M1990 Unspecified osteoarthritis, unspecified site: Secondary | ICD-10-CM | POA: Diagnosis present

## 2013-11-12 DIAGNOSIS — Z6823 Body mass index (BMI) 23.0-23.9, adult: Secondary | ICD-10-CM | POA: Diagnosis not present

## 2013-11-12 DIAGNOSIS — F17210 Nicotine dependence, cigarettes, uncomplicated: Secondary | ICD-10-CM | POA: Diagnosis present

## 2013-11-12 DIAGNOSIS — C7801 Secondary malignant neoplasm of right lung: Secondary | ICD-10-CM | POA: Diagnosis present

## 2013-11-12 DIAGNOSIS — C7931 Secondary malignant neoplasm of brain: Secondary | ICD-10-CM

## 2013-11-12 DIAGNOSIS — W06XXXA Fall from bed, initial encounter: Secondary | ICD-10-CM | POA: Diagnosis present

## 2013-11-12 DIAGNOSIS — Z66 Do not resuscitate: Secondary | ICD-10-CM | POA: Diagnosis present

## 2013-11-12 DIAGNOSIS — C7971 Secondary malignant neoplasm of right adrenal gland: Secondary | ICD-10-CM | POA: Diagnosis not present

## 2013-11-12 DIAGNOSIS — I10 Essential (primary) hypertension: Secondary | ICD-10-CM | POA: Diagnosis not present

## 2013-11-12 DIAGNOSIS — R7401 Elevation of levels of liver transaminase levels: Secondary | ICD-10-CM

## 2013-11-12 DIAGNOSIS — W19XXXA Unspecified fall, initial encounter: Secondary | ICD-10-CM

## 2013-11-12 DIAGNOSIS — H548 Legal blindness, as defined in USA: Secondary | ICD-10-CM | POA: Diagnosis not present

## 2013-11-12 DIAGNOSIS — C7802 Secondary malignant neoplasm of left lung: Secondary | ICD-10-CM | POA: Diagnosis present

## 2013-11-12 DIAGNOSIS — M84551A Pathological fracture in neoplastic disease, right femur, initial encounter for fracture: Secondary | ICD-10-CM | POA: Diagnosis not present

## 2013-11-12 DIAGNOSIS — C799 Secondary malignant neoplasm of unspecified site: Secondary | ICD-10-CM

## 2013-11-12 DIAGNOSIS — R1310 Dysphagia, unspecified: Secondary | ICD-10-CM | POA: Diagnosis not present

## 2013-11-12 DIAGNOSIS — E440 Moderate protein-calorie malnutrition: Secondary | ICD-10-CM

## 2013-11-12 DIAGNOSIS — R531 Weakness: Secondary | ICD-10-CM

## 2013-11-12 DIAGNOSIS — S72111A Displaced fracture of greater trochanter of right femur, initial encounter for closed fracture: Secondary | ICD-10-CM | POA: Diagnosis present

## 2013-11-12 DIAGNOSIS — R627 Adult failure to thrive: Secondary | ICD-10-CM

## 2013-11-12 DIAGNOSIS — S72111D Displaced fracture of greater trochanter of right femur, subsequent encounter for closed fracture with routine healing: Secondary | ICD-10-CM

## 2013-11-12 DIAGNOSIS — C7902 Secondary malignant neoplasm of left kidney and renal pelvis: Secondary | ICD-10-CM | POA: Diagnosis present

## 2013-11-12 DIAGNOSIS — S72009A Fracture of unspecified part of neck of unspecified femur, initial encounter for closed fracture: Secondary | ICD-10-CM

## 2013-11-12 DIAGNOSIS — Z7189 Other specified counseling: Secondary | ICD-10-CM

## 2013-11-12 DIAGNOSIS — Y92003 Bedroom of unspecified non-institutional (private) residence as the place of occurrence of the external cause: Secondary | ICD-10-CM | POA: Diagnosis not present

## 2013-11-12 DIAGNOSIS — S72001A Fracture of unspecified part of neck of right femur, initial encounter for closed fracture: Secondary | ICD-10-CM | POA: Diagnosis present

## 2013-11-12 DIAGNOSIS — C801 Malignant (primary) neoplasm, unspecified: Secondary | ICD-10-CM | POA: Diagnosis present

## 2013-11-12 DIAGNOSIS — M25551 Pain in right hip: Secondary | ICD-10-CM | POA: Diagnosis present

## 2013-11-12 DIAGNOSIS — R634 Abnormal weight loss: Secondary | ICD-10-CM | POA: Diagnosis not present

## 2013-11-12 DIAGNOSIS — C787 Secondary malignant neoplasm of liver and intrahepatic bile duct: Secondary | ICD-10-CM | POA: Diagnosis present

## 2013-11-12 DIAGNOSIS — G893 Neoplasm related pain (acute) (chronic): Secondary | ICD-10-CM

## 2013-11-12 DIAGNOSIS — K819 Cholecystitis, unspecified: Secondary | ICD-10-CM

## 2013-11-12 DIAGNOSIS — C7972 Secondary malignant neoplasm of left adrenal gland: Secondary | ICD-10-CM | POA: Diagnosis not present

## 2013-11-12 DIAGNOSIS — Z515 Encounter for palliative care: Secondary | ICD-10-CM

## 2013-11-12 DIAGNOSIS — R740 Nonspecific elevation of levels of transaminase and lactic acid dehydrogenase [LDH]: Secondary | ICD-10-CM

## 2013-11-12 LAB — URINALYSIS, ROUTINE W REFLEX MICROSCOPIC
Bilirubin Urine: NEGATIVE
Glucose, UA: NEGATIVE mg/dL
Ketones, ur: NEGATIVE mg/dL
Leukocytes, UA: NEGATIVE
Nitrite: NEGATIVE
Protein, ur: 30 mg/dL — AB
SPECIFIC GRAVITY, URINE: 1.011 (ref 1.005–1.030)
Urobilinogen, UA: 0.2 mg/dL (ref 0.0–1.0)
pH: 7 (ref 5.0–8.0)

## 2013-11-12 LAB — CBC
HCT: 33.7 % — ABNORMAL LOW (ref 36.0–46.0)
Hemoglobin: 11.3 g/dL — ABNORMAL LOW (ref 12.0–15.0)
MCH: 31.5 pg (ref 26.0–34.0)
MCHC: 33.5 g/dL (ref 30.0–36.0)
MCV: 93.9 fL (ref 78.0–100.0)
Platelets: 209 10*3/uL (ref 150–400)
RBC: 3.59 MIL/uL — AB (ref 3.87–5.11)
RDW: 12.3 % (ref 11.5–15.5)
WBC: 8.1 10*3/uL (ref 4.0–10.5)

## 2013-11-12 LAB — COMPREHENSIVE METABOLIC PANEL
ALBUMIN: 3.1 g/dL — AB (ref 3.5–5.2)
ALT: 136 U/L — ABNORMAL HIGH (ref 0–35)
ANION GAP: 13 (ref 5–15)
AST: 213 U/L — ABNORMAL HIGH (ref 0–37)
Alkaline Phosphatase: 294 U/L — ABNORMAL HIGH (ref 39–117)
BUN: 15 mg/dL (ref 6–23)
CO2: 27 mEq/L (ref 19–32)
Calcium: 9.4 mg/dL (ref 8.4–10.5)
Chloride: 98 mEq/L (ref 96–112)
Creatinine, Ser: 0.86 mg/dL (ref 0.50–1.10)
GFR calc Af Amer: 70 mL/min — ABNORMAL LOW (ref >90)
GFR calc non Af Amer: 60 mL/min — ABNORMAL LOW (ref >90)
Glucose, Bld: 116 mg/dL — ABNORMAL HIGH (ref 70–99)
Potassium: 4.2 mEq/L (ref 3.7–5.3)
SODIUM: 138 meq/L (ref 137–147)
TOTAL PROTEIN: 8.4 g/dL — AB (ref 6.0–8.3)
Total Bilirubin: 1 mg/dL (ref 0.3–1.2)

## 2013-11-12 LAB — CBC WITH DIFFERENTIAL/PLATELET
BASOS ABS: 0.1 10*3/uL (ref 0.0–0.1)
Basophils Relative: 1 % (ref 0–1)
EOS ABS: 0.3 10*3/uL (ref 0.0–0.7)
Eosinophils Relative: 3 % (ref 0–5)
HCT: 37 % (ref 36.0–46.0)
Hemoglobin: 12.7 g/dL (ref 12.0–15.0)
LYMPHS PCT: 18 % (ref 12–46)
Lymphs Abs: 1.7 10*3/uL (ref 0.7–4.0)
MCH: 31.8 pg (ref 26.0–34.0)
MCHC: 34.3 g/dL (ref 30.0–36.0)
MCV: 92.5 fL (ref 78.0–100.0)
Monocytes Absolute: 0.7 10*3/uL (ref 0.1–1.0)
Monocytes Relative: 8 % (ref 3–12)
NEUTROS PCT: 70 % (ref 43–77)
Neutro Abs: 6.4 10*3/uL (ref 1.7–7.7)
Platelets: 268 10*3/uL (ref 150–400)
RBC: 4 MIL/uL (ref 3.87–5.11)
RDW: 12 % (ref 11.5–15.5)
WBC: 9.2 10*3/uL (ref 4.0–10.5)

## 2013-11-12 LAB — URINE MICROSCOPIC-ADD ON

## 2013-11-12 LAB — GLUCOSE, CAPILLARY: GLUCOSE-CAPILLARY: 105 mg/dL — AB (ref 70–99)

## 2013-11-12 LAB — ABO/RH: ABO/RH(D): A POS

## 2013-11-12 LAB — PROTIME-INR
INR: 1.1 (ref 0.00–1.49)
PROTHROMBIN TIME: 14.3 s (ref 11.6–15.2)

## 2013-11-12 LAB — HEMOGLOBIN A1C
HEMOGLOBIN A1C: 6.9 % — AB (ref <5.7)
MEAN PLASMA GLUCOSE: 151 mg/dL — AB (ref <117)

## 2013-11-12 LAB — CREATININE, SERUM
Creatinine, Ser: 0.84 mg/dL (ref 0.50–1.10)
GFR calc non Af Amer: 62 mL/min — ABNORMAL LOW (ref >90)
GFR, EST AFRICAN AMERICAN: 72 mL/min — AB (ref >90)

## 2013-11-12 LAB — TROPONIN I: Troponin I: <0.3 ng/mL (ref <0.30)

## 2013-11-12 LAB — TYPE AND SCREEN
ABO/RH(D): A POS
ANTIBODY SCREEN: NEGATIVE

## 2013-11-12 MED ORDER — INSULIN ASPART 100 UNIT/ML ~~LOC~~ SOLN
0.0000 [IU] | Freq: Three times a day (TID) | SUBCUTANEOUS | Status: DC
  Administered 2013-11-13: 1 [IU] via SUBCUTANEOUS

## 2013-11-12 MED ORDER — OXYCODONE-ACETAMINOPHEN 5-325 MG PO TABS
2.0000 | ORAL_TABLET | Freq: Once | ORAL | Status: AC
  Administered 2013-11-12: 2 via ORAL
  Filled 2013-11-12: qty 2

## 2013-11-12 MED ORDER — ZOLPIDEM TARTRATE 5 MG PO TABS
5.0000 mg | ORAL_TABLET | Freq: Every evening | ORAL | Status: DC | PRN
Start: 2013-11-12 — End: 2013-11-17

## 2013-11-12 MED ORDER — ALBUTEROL SULFATE (2.5 MG/3ML) 0.083% IN NEBU: 2.5000 mg | INHALATION_SOLUTION | Freq: Four times a day (QID) | RESPIRATORY_TRACT | Status: DC | PRN

## 2013-11-12 MED ORDER — MAGNESIUM CITRATE PO SOLN: 1.0000 | Freq: Once | ORAL | Status: AC | PRN

## 2013-11-12 MED ORDER — NAPROXEN 500 MG PO TABS
500.0000 mg | ORAL_TABLET | Freq: Two times a day (BID) | ORAL | Status: DC | PRN
  Filled 2013-11-12: qty 1

## 2013-11-12 MED ORDER — METHOCARBAMOL 1000 MG/10ML IJ SOLN
500.0000 mg | Freq: Four times a day (QID) | INTRAVENOUS | Status: DC | PRN
  Filled 2013-11-12: qty 5

## 2013-11-12 MED ORDER — HYDROCODONE-ACETAMINOPHEN 5-325 MG PO TABS: 1.0000 | ORAL_TABLET | Freq: Four times a day (QID) | ORAL | Status: DC | PRN

## 2013-11-12 MED ORDER — MORPHINE SULFATE 2 MG/ML IJ SOLN: 2.0000 mg | INTRAMUSCULAR | Status: DC | PRN

## 2013-11-12 MED ORDER — DOCUSATE SODIUM 100 MG PO CAPS
100.0000 mg | ORAL_CAPSULE | Freq: Two times a day (BID) | ORAL | Status: DC
  Administered 2013-11-12 – 2013-11-17 (×9): 100 mg via ORAL
  Filled 2013-11-12 (×11): qty 1

## 2013-11-12 MED ORDER — SODIUM CHLORIDE 0.9 % IV SOLN
INTRAVENOUS | Status: DC
  Administered 2013-11-12 – 2013-11-16 (×4): via INTRAVENOUS
  Administered 2013-11-16: 1000 mL via INTRAVENOUS
  Administered 2013-11-17: 11:00:00 via INTRAVENOUS

## 2013-11-12 MED ORDER — MORPHINE SULFATE 2 MG/ML IJ SOLN
0.5000 mg | INTRAMUSCULAR | Status: DC | PRN
Start: 2013-11-12 — End: 2013-11-17
  Administered 2013-11-16 – 2013-11-17 (×2): 0.5 mg via INTRAVENOUS
  Filled 2013-11-12 (×2): qty 1

## 2013-11-12 MED ORDER — POLYETHYLENE GLYCOL 3350 17 G PO PACK
17.0000 g | PACK | Freq: Every day | ORAL | Status: DC | PRN
  Administered 2013-11-16 – 2013-11-17 (×2): 17 g via ORAL
  Filled 2013-11-12: qty 1

## 2013-11-12 MED ORDER — ONDANSETRON HCL 4 MG/2ML IJ SOLN
4.0000 mg | Freq: Four times a day (QID) | INTRAMUSCULAR | Status: DC | PRN
  Administered 2013-11-14 – 2013-11-15 (×2): 4 mg via INTRAVENOUS
  Filled 2013-11-12 (×2): qty 2

## 2013-11-12 MED ORDER — IPRATROPIUM-ALBUTEROL 0.5-2.5 (3) MG/3ML IN SOLN: 3.0000 mL | RESPIRATORY_TRACT | Status: DC | PRN

## 2013-11-12 MED ORDER — MORPHINE SULFATE 4 MG/ML IJ SOLN
4.0000 mg | Freq: Once | INTRAMUSCULAR | Status: AC
  Administered 2013-11-12: 4 mg via INTRAVENOUS
  Filled 2013-11-12: qty 1

## 2013-11-12 MED ORDER — IBUPROFEN 200 MG PO TABS: 400.0000 mg | ORAL_TABLET | ORAL | Status: DC | PRN

## 2013-11-12 MED ORDER — ENOXAPARIN SODIUM 40 MG/0.4ML ~~LOC~~ SOLN
40.0000 mg | SUBCUTANEOUS | Status: DC
  Administered 2013-11-14 – 2013-11-16 (×3): 40 mg via SUBCUTANEOUS
  Filled 2013-11-12 (×6): qty 0.4

## 2013-11-12 MED ORDER — SODIUM CHLORIDE 0.9 % IV BOLUS (SEPSIS)
500.0000 mL | Freq: Once | INTRAVENOUS | Status: AC
  Administered 2013-11-12: 500 mL via INTRAVENOUS

## 2013-11-12 MED ORDER — PANTOPRAZOLE SODIUM 40 MG PO TBEC
40.0000 mg | DELAYED_RELEASE_TABLET | Freq: Every day | ORAL | Status: DC
  Administered 2013-11-13 – 2013-11-17 (×5): 40 mg via ORAL
  Filled 2013-11-12 (×6): qty 1

## 2013-11-12 MED ORDER — SORBITOL 70 % SOLN: 30.0000 mL | Freq: Every day | Status: DC | PRN

## 2013-11-12 MED ORDER — ENSURE COMPLETE PO LIQD
237.0000 mL | Freq: Two times a day (BID) | ORAL | Status: DC
  Administered 2013-11-13 – 2013-11-16 (×3): 237 mL via ORAL

## 2013-11-12 MED ORDER — VERAPAMIL HCL ER 180 MG PO TBCR
180.0000 mg | EXTENDED_RELEASE_TABLET | Freq: Every day | ORAL | Status: DC
  Administered 2013-11-12 – 2013-11-15 (×4): 180 mg via ORAL
  Filled 2013-11-12 (×5): qty 1

## 2013-11-12 MED ORDER — METHOCARBAMOL 500 MG PO TABS
500.0000 mg | ORAL_TABLET | Freq: Four times a day (QID) | ORAL | Status: DC | PRN
  Administered 2013-11-13: 500 mg via ORAL
  Filled 2013-11-12: qty 1

## 2013-11-12 MED ORDER — NICOTINE 14 MG/24HR TD PT24
14.0000 mg | MEDICATED_PATCH | Freq: Every day | TRANSDERMAL | Status: DC
  Administered 2013-11-12 – 2013-11-17 (×6): 14 mg via TRANSDERMAL
  Filled 2013-11-12 (×6): qty 1

## 2013-11-12 MED ORDER — HYDROCODONE-ACETAMINOPHEN 5-325 MG PO TABS
1.0000 | ORAL_TABLET | Freq: Four times a day (QID) | ORAL | Status: DC | PRN
  Administered 2013-11-12 – 2013-11-15 (×5): 2 via ORAL
  Filled 2013-11-12 (×5): qty 2

## 2013-11-12 MED ORDER — MEGESTROL ACETATE 40 MG PO TABS
40.0000 mg | ORAL_TABLET | Freq: Every day | ORAL | Status: DC
  Administered 2013-11-12 – 2013-11-17 (×5): 40 mg via ORAL
  Filled 2013-11-12 (×6): qty 1

## 2013-11-12 NOTE — ED Provider Notes (Signed)
CSN: [id number]     Arrival date & time 11/12/13  1012 History   First MD Initiated Contact with Patient 11/12/13 1036     Chief Complaint  Patient presents with  . Fall  . Hip Pain  . Head Laceration     (Consider location/radiation/quality/duration/timing/severity/associated sxs/prior Treatment) HPI Comments: Patient here after sustaining a fall when she stood up which occurred just prior to arrival. Patient recently diagnosed with cancer although she is unsure of what type it is. She is scheduled next week for multiple testing to determine this. When she stood up today, she became lightheaded and dizzy and fell and struck her occiput. No loss of consciousness. Denies any neck or back pain. She has had anorexia. Denies any abdominal pain or chest pain. No shortness of breath. Weakness is diffuse. She also complains of right sharp hip pain is worse with standing. EMS was called and patient transported here.  Patient is a 80 y.o. female presenting with fall, hip pain, and scalp laceration. The history is provided by the patient and a relative.  Fall  Hip Pain  Head Laceration    Past Medical History  Diagnosis Date  . Hypertension   . Diabetes mellitus   . Arthritis   . Metastatic cancer 10/26/2013   Past Surgical History  Procedure Laterality Date  . Ectopic pregnancy surgery     History reviewed. No pertinent family history. History  Substance Use Topics  . Smoking status: Current Every Day Smoker -- 0.25 packs/day  . Smokeless tobacco: Not on file  . Alcohol Use: No   OB History   Grav Para Term Preterm Abortions TAB SAB Ect Mult Living                 Review of Systems  All other systems reviewed and are negative.     Allergies  Review of patient's allergies indicates no known allergies.  Home Medications   Prior to Admission medications   Medication Sig Start Date End Date Taking? Authorizing Provider  acetaminophen (TYLENOL) 500 MG tablet Take 1,000 mg  by mouth every 6 (six) hours as needed for moderate pain.    Historical Provider, MD  albuterol (PROVENTIL HFA;VENTOLIN HFA) 108 (90 BASE) MCG/ACT inhaler Inhale 2 puffs into the lungs every 6 (six) hours as needed for wheezing or shortness of breath.    Historical Provider, MD  hydrochlorothiazide (MICROZIDE) 12.5 MG capsule Take 25 mg by mouth daily.     Historical Provider, MD  HYDROcodone-acetaminophen (NORCO/VICODIN) 5-325 MG per tablet Take 1 tablet by mouth every 6 (six) hours as needed for moderate pain. 10/16/13   Evelina Bucy, MD  naproxen (NAPROSYN) 500 MG tablet Take 500 mg by mouth 2 (two) times daily as needed for moderate pain.    Historical Provider, MD  verapamil (COVERA HS) 180 MG (CO) 24 hr tablet Take 180 mg by mouth at bedtime.      Historical Provider, MD   BP 163/80  Pulse 77  Temp(Src) 98.7 F (37.1 C) (Oral)  Resp 18  SpO2 94% Physical Exam  Nursing note and vitals reviewed. Constitutional: She is oriented to person, place, and time. She appears cachectic.  Non-toxic appearance. She has a sickly appearance. No distress.  HENT:  Head: Normocephalic and atraumatic.    Eyes: Conjunctivae, EOM and lids are normal. Pupils are equal, round, and reactive to light.  Neck: Normal range of motion. Neck supple. No spinous process tenderness and no muscular tenderness present. No tracheal  deviation present. No mass present.  Large firm lymph node on the left side of her neck which is not mobile. Moderate size right-sided lymphadenopathy as well. Right-sided supraclavicular node noted.  Cardiovascular: Normal rate, regular rhythm and normal heart sounds.  Exam reveals no gallop.   No murmur heard. Pulmonary/Chest: Effort normal and breath sounds normal. No stridor. No respiratory distress. She has no decreased breath sounds. She has no wheezes. She has no rhonchi. She has no rales.  Abdominal: Soft. Normal appearance and bowel sounds are normal. She exhibits no distension. There  is no tenderness. There is no rebound and no CVA tenderness.  Musculoskeletal: Normal range of motion. She exhibits no edema and no tenderness.  Full range of motion at hip on the right. No shortening or rotation noted. Tender to palpation at lateral aspect of right hip. Neurovascularly intact  Neurological: She is alert and oriented to person, place, and time. She has normal strength. No cranial nerve deficit or sensory deficit. GCS eye subscore is 4. GCS verbal subscore is 5. GCS motor subscore is 6.  Skin: Skin is warm and dry. No abrasion and no rash noted.  Psychiatric: She has a normal mood and affect. Her speech is normal and behavior is normal.    ED Course  Procedures (including critical care time) Labs Review Labs Reviewed  URINE CULTURE  URINALYSIS, ROUTINE W REFLEX MICROSCOPIC  CBC WITH DIFFERENTIAL  COMPREHENSIVE METABOLIC PANEL  TROPONIN I    Imaging Review No results found.   EKG Interpretation None      MDM   Final diagnoses:  Fall    Patient given pain medications here. X-rays results review with orthopedist on call and he will see the patient in consultation tomorrow. I will contact the [facility] is for admission due to the patient not be able to ambulate    Leota Jacobsen, MD 11/12/13 1606

## 2013-11-12 NOTE — ED Notes (Signed)
Per EMs. Pt from home. Reports falling 2x this am. Pt told EMS she felt weak and tripped in her kitchen. Hit posterior head on cabinet and has laceration to back of head. Pt standing upon EMS arrival, complaining of R hip pain. Pt reports she was diagnosed with cancer 2 weeks ago and will find out what type this week at doctor's appointment. Also complains of decreased vision and speech over past 2 weeks as well. Denies LOC.

## 2013-11-12 NOTE — ED Notes (Signed)
MD at bedside. 

## 2013-11-12 NOTE — H&P (Signed)
[facility] History and Physical  Sonia Weeks:[id number] DOB: December 22, 1927 DOA: 11/12/2013  Referring physician: Dr. Lacretia Leigh PCP: Ricke Hey, MD   Chief Complaint: Fall  HPI: Sonia Weeks is a 80 y.o. female  With history of hypertension, diabetes, recently diagnosed metastatic cancer of unknown primary, 10/16/2013. Chest x-ray which showed a masslike density in the left perihilar region followed by CT scan of the chest abdomen and pelvis with several findings of lymphadenopathy nodules and lesions in the liver, adrenal glands, left kidney and ovaries with extensive lymphadenopathy in the abdomen, who is being followed by Dr. Lorna Few. Patient presents to the emergency room with a fall and right hip pain. Per patient and daughter patient was getting out of bed when she stated she slid between the chest and the vanity including get up with excruciating right hip pain. Patient stated she crawled to the telephone was able to call her daughter came by to see her. Patient's daughter stated that when she got there she found patient on the living room floor and felt she may have fallen again. EMS was called and patient was brought to the emergency room. Patient denies any fevers, no chills, no nausea, no vomiting, no chest pain, no shortness of breath, no diarrhea, no constipation, no dysuria. Daughter states patient has been very weak and barely able to ambulate with a 25 pound weight loss over several months. Patient also with decreased oral intake secondary to decreased appetite. Patient with pain in her lower back stomach and legs currently on chronic narcotic pain medications for this. Patient and daughter state that she was scheduled for staging workup for cancer this week beginning with a biopsy tomorrow, PET scan on Tuesday, MRI on Wednesday. Patient was seen in the emergency room, comprehensive metabolic profile done at a elevated alk phosphatase of 294 albumin of  3.1 AST and ALT were elevated at 213 and 136 respectively. First troponin was negative. CBC was unremarkable. Urinalysis was nitrite negative leukocytes negative. X-ray of the right hip was negative. CT of the right hip showed a nondisplaced fracture involving the right greater trochanter. EKG shows T-wave inversions in lead III and V3 through V6 similar to prior EKG.  ED physician stated he spoke with orthopedics, Dr. Tonita Cong requested patient be admitted to the medical service and patient was seen in consultation per orthopedics. [facility] were consulted for admission and further evaluation and management.   Review of Systems: As history of present illness otherwise negative. Constitutional:  No weight loss, night sweats, Fevers, chills, fatigue.  HEENT:  No headaches, Difficulty swallowing,Tooth/dental problems,Sore throat,  No sneezing, itching, ear ache, nasal congestion, post nasal drip,  Cardio-vascular:  No chest pain, Orthopnea, PND, swelling in lower extremities, anasarca, dizziness, palpitations  GI:  No heartburn, indigestion, abdominal pain, nausea, vomiting, diarrhea, change in bowel habits, loss of appetite  Resp:  No shortness of breath with exertion or at rest. No excess mucus, no productive cough, No non-productive cough, No coughing up of blood.No change in color of mucus.No wheezing.No chest wall deformity  Skin:  no rash or lesions.  GU:  no dysuria, change in color of urine, no urgency or frequency. No flank pain.  Musculoskeletal:  No joint pain or swelling. No decreased range of motion. No back pain.  Psych:  No change in mood or affect. No depression or anxiety. No memory loss.   Past Medical History  Diagnosis Date  . Hypertension   . Diabetes mellitus   .  Arthritis   . Metastatic cancer 10/26/2013   Past Surgical History  Procedure Laterality Date  . Ectopic pregnancy surgery     Social History:  reports that she has been smoking.  She does not  have any smokeless tobacco history on file. She reports that she does not drink alcohol or use illicit drugs.  No Known Allergies  History reviewed. No pertinent family history.   Prior to Admission medications   Medication Sig Start Date End Date Taking? Authorizing Provider  acetaminophen (TYLENOL) 500 MG tablet Take 1,000 mg by mouth every 6 (six) hours as needed for moderate pain.   Yes Historical Provider, MD  albuterol (PROVENTIL HFA;VENTOLIN HFA) 108 (90 BASE) MCG/ACT inhaler Inhale 2 puffs into the lungs every 6 (six) hours as needed for wheezing or shortness of breath.   Yes Historical Provider, MD  HYDROcodone-acetaminophen (NORCO/VICODIN) 5-325 MG per tablet Take 1-2 tablets by mouth every 6 (six) hours as needed for moderate pain.   Yes Historical Provider, MD  naproxen (NAPROSYN) 500 MG tablet Take 500 mg by mouth 2 (two) times daily as needed for moderate pain.   Yes Historical Provider, MD  verapamil (COVERA HS) 180 MG (CO) 24 hr tablet Take 180 mg by mouth at bedtime.     Yes Historical Provider, MD  hydrochlorothiazide (MICROZIDE) 12.5 MG capsule Take 25 mg by mouth daily.     Historical Provider, MD   Physical Exam: Filed Vitals:   11/12/13 1300 11/12/13 1444 11/12/13 1530 11/12/13 1630  BP: 177/78 163/88 161/76 168/60  Pulse:  86 67 77  Temp:      TempSrc:      Resp:  16  14  SpO2:  95% 94% 95%    Wt Readings from Last 3 Encounters:  10/26/13 58.242 kg (128 lb 6.4 oz)  01/13/11 72 kg (158 lb 11.7 oz)    General:  Well-developed well-nourished lady Lane on gurney in no acute cardiopulmonary distress.  Eyes: PERRLA, EOMI, normal lids, irises & conjunctiva ENT: grossly normal hearing, lips & tongue Neck: Large firm masslike/lymph node on the left side of the neck painless with some mobility. Moderate size right-sided lymphadenopathy. Right-sided supraclavicular node also noted. Cardiovascular: RRR, no m/r/g. No LE edema. Respiratory: CTA bilaterally, no w/r/r.  Normal respiratory effort. Abdomen: soft, ntnd, positive bowel sounds, no rebound, no guarding Skin: no rash or induration seen on limited exam Musculoskeletal: 4/5 bilateral upper extremity strength.3-4/5 bilateral lower extremity strength. Psychiatric: grossly normal mood and affect, speech fluent and appropriate Neurologic: Alert and oriented 3. Cranial nerve II through XII are grossly intact. No focal deficits.           Labs on Admission:  Basic Metabolic Panel:  Recent Labs Lab 11/12/13 1114  NA 138  K 4.2  CL 98  CO2 27  GLUCOSE 116*  BUN 15  CREATININE 0.86  CALCIUM 9.4   Liver Function Tests:  Recent Labs Lab 11/12/13 1114  AST 213*  ALT 136*  ALKPHOS 294*  BILITOT 1.0  PROT 8.4*  ALBUMIN 3.1*   No results found for this basename: LIPASE, AMYLASE,  in the last 168 hours No results found for this basename: AMMONIA,  in the last 168 hours CBC:  Recent Labs Lab 11/12/13 1114  WBC 9.2  NEUTROABS 6.4  HGB 12.7  HCT 37.0  MCV 92.5  PLT 268   Cardiac Enzymes:  Recent Labs Lab 11/12/13 1114  TROPONINI <0.30    BNP (last 3 results)  Recent  Labs  10/16/13 1320  PROBNP 1161.0*   CBG: No results found for this basename: GLUCAP,  in the last 168 hours  Radiological Exams on Admission: Dg Hip Complete Right  11/12/2013   CLINICAL DATA:  Patient fell in her home this morning injuring right hip. Right hip pain.  EXAM: RIGHT HIP - COMPLETE 2+ VIEW  COMPARISON:  None.  FINDINGS: No fracture. Hip joint is normally aligned. Bones are diffusely demineralized.  Pelvis is intact. SI joints and symphysis pubis are normally aligned.  Bones are diffusely demineralized.  Soft tissues are unremarkable.  IMPRESSION: No fracture or dislocation.   Electronically Signed   By: Lajean Manes M.D.   On: 11/12/2013 11:39   Ct Head Wo Contrast  11/12/2013   CLINICAL DATA:  Status post fall is more known with injury/laceration.  EXAM: CT HEAD WITHOUT CONTRAST   TECHNIQUE: Contiguous axial images were obtained from the base of the skull through the vertex without intravenous contrast.  COMPARISON:  CT neck 01/13/2011  FINDINGS: Ventricles are normal in size. Negative for intra or extra-axial hemorrhage, mass effect, mass lesion, or evidence of acute cortically based infarction. Mild chronic microvascular ischemic changes in the periventricular white matter bilaterally. Dural calcifications noted near the vertex.  Extensive sinus disease noted with frothy secretions and air-fluid levels in both maxillary sinuses and scattered fluid in the ethmoid air cells. Visualized mastoid air cells are clear. Inferior aspect of the right mastoid air cells not included on the images.  Skull is somewhat thickened. Negative for skull fracture. Mandibular condyles located.  Soft tissues of the scalp are symmetric and there is no evidence of scalp hematoma.  IMPRESSION: Acute appearing bilateral axillary and ethmoid sinusitis.  Negative for scalp hematoma or skull fracture.  No acute intracranial abnormality.   Electronically Signed   By: Curlene Dolphin M.D.   On: 11/12/2013 11:37   Ct Hip Right Wo Contrast  11/12/2013   CLINICAL DATA:  Fall, right hip pain  EXAM: CT OF THE RIGHT HIP WITHOUT CONTRAST  TECHNIQUE: Multidetector CT imaging of the right hip was performed according to the standard protocol. Multiplanar CT image reconstructions were also generated.  COMPARISON:  Right hip radiographs dated 11/12/2013  FINDINGS: Nondisplaced fracture involving the right greater trochanter (series 4/image 52).  Right femoral neck is intact.  Mild degenerative changes of the right hip.  Visualized pelvic soft tissues are notable for vascular calcifications but are otherwise within normal limits.  IMPRESSION: Nondisplaced fracture involving the right greater trochanter.   Electronically Signed   By: Julian Hy M.D.   On: 11/12/2013 15:30    EKG: Independently reviewed. Normal sinus rhythm  with prolonged QTC, T-wave inversions in lead V3 through V6, and lead 3 similar to prior EKG.  Assessment/Plan Principal Problem:   Fracture of greater trochanter of right femur Active Problems:   Hypertension   Metastatic cancer   Transaminitis   FTT (failure to thrive) in adult   Weakness generalized   Diabetes mellitus   Arthritis   Hip fracture, right  #1 fracture of the greater trochanter of the right hip Secondary to mechanical fall and probable pathologic fracture as patient was recently diagnosed with metastatic cancer of unknown primary. Will admit the patient to a MedSurg floor. Bedrest. ED physician stated he spoke with Dr. Tonita Cong of orthopedics who will assist the patient in consult. PT/OT. Lovenox. Will likely need placement.  #2 widespread metastatic cancer of unknown primary Patient had been seen in consultation  by Dr. Lorna Few as outpatient on 10/26/2013 and per patient and daughter patient was set up for staging this coming week. Will inform oncology of patient's admission. We'll get a MRI of the head while she is in house. PET scan will have to be done as outpatient. Will need to contact general surgery once patient is inpatient for tissue diagnosis.  #3 failure to thrive in an adult/generalized weakness Likely secondary to problem #1. Patient has had decreased appetite. Decreased oral intake. 28 pound weight loss over several months. Will place patient on Megace. Ensure supplementation. Nutrition consult. Follow.  #4 diabetes mellitus Due to patient's failure to thrive we will liberalize her diet. Check a hemoglobin A1c. Place on a sliding scale insulin.  #5 hypertension Continue home regimen of verapamil.  #6 transaminitis Likely secondary to metastatic cancer to the liver. Follow.  #7 prophylaxis PPI for GI prophylaxis. Lovenox for DVT prophylaxis.  Code Status: DO NOT RESUSCITATE DVT Prophylaxis: Lovenox Family Communication: Updated patient and  daughter at bedside. Disposition Plan: Admit to [facility].  Time spent: [facility] Pager [phone]

## 2013-11-13 ENCOUNTER — Inpatient Hospital Stay (HOSPITAL_COMMUNITY): Payer: Medicare Other

## 2013-11-13 ENCOUNTER — Ambulatory Visit
Admit: 2013-11-13 | Discharge: 2013-11-13 | Disposition: A | Discharge status: Home / Self Care | Payer: Medicare Other | Attending: Radiation Oncology | Admitting: Radiation Oncology

## 2013-11-13 ENCOUNTER — Encounter: Payer: Self-pay | Admitting: *Deleted

## 2013-11-13 DIAGNOSIS — S72111D Displaced fracture of greater trochanter of right femur, subsequent encounter for closed fracture with routine healing: Secondary | ICD-10-CM

## 2013-11-13 DIAGNOSIS — C7931 Secondary malignant neoplasm of brain: Secondary | ICD-10-CM

## 2013-11-13 DIAGNOSIS — R531 Weakness: Secondary | ICD-10-CM

## 2013-11-13 DIAGNOSIS — W19XXXA Unspecified fall, initial encounter: Secondary | ICD-10-CM | POA: Insufficient documentation

## 2013-11-13 DIAGNOSIS — E440 Moderate protein-calorie malnutrition: Secondary | ICD-10-CM

## 2013-11-13 LAB — CBC
HCT: 32.9 % — ABNORMAL LOW (ref 36.0–46.0)
Hemoglobin: 11.1 g/dL — ABNORMAL LOW (ref 12.0–15.0)
MCH: 31.3 pg (ref 26.0–34.0)
MCHC: 33.7 g/dL (ref 30.0–36.0)
MCV: 92.7 fL (ref 78.0–100.0)
PLATELETS: 243 10*3/uL (ref 150–400)
RBC: 3.55 MIL/uL — AB (ref 3.87–5.11)
RDW: 12.2 % (ref 11.5–15.5)
WBC: 7.7 10*3/uL (ref 4.0–10.5)

## 2013-11-13 LAB — BASIC METABOLIC PANEL
ANION GAP: 13 (ref 5–15)
BUN: 12 mg/dL (ref 6–23)
CO2: 24 meq/L (ref 19–32)
Calcium: 8.7 mg/dL (ref 8.4–10.5)
Chloride: 98 mEq/L (ref 96–112)
Creatinine, Ser: 0.78 mg/dL (ref 0.50–1.10)
GFR calc Af Amer: 87 mL/min — ABNORMAL LOW (ref >90)
GFR, EST NON AFRICAN AMERICAN: 75 mL/min — AB (ref >90)
GLUCOSE: 101 mg/dL — AB (ref 70–99)
POTASSIUM: 3.9 meq/L (ref 3.7–5.3)
SODIUM: 135 meq/L — AB (ref 137–147)

## 2013-11-13 LAB — GLUCOSE, CAPILLARY
Glucose-Capillary: 94 mg/dL (ref 70–99)
Glucose-Capillary: 136 mg/dL — ABNORMAL HIGH (ref 70–99)
Glucose-Capillary: 115 mg/dL — ABNORMAL HIGH (ref 70–99)
Glucose-Capillary: 162 mg/dL — ABNORMAL HIGH (ref 70–99)

## 2013-11-13 LAB — HEPATIC FUNCTION PANEL
ALBUMIN: 2.4 g/dL — AB (ref 3.5–5.2)
ALT: 156 U/L — ABNORMAL HIGH (ref 0–35)
AST: 185 U/L — ABNORMAL HIGH (ref 0–37)
Alkaline Phosphatase: 301 U/L — ABNORMAL HIGH (ref 39–117)
BILIRUBIN DIRECT: 1.1 mg/dL — AB (ref 0.0–0.3)
BILIRUBIN TOTAL: 1.8 mg/dL — AB (ref 0.3–1.2)
Indirect Bilirubin: 0.7 mg/dL (ref 0.3–0.9)
Total Protein: 7 g/dL (ref 6.0–8.3)

## 2013-11-13 MED ORDER — INFLUENZA VAC SPLIT QUAD 0.5 ML IM SUSY
0.5000 mL | PREFILLED_SYRINGE | INTRAMUSCULAR | Status: AC
  Administered 2013-11-16: 0.5 mL via INTRAMUSCULAR
  Filled 2013-11-13 (×3): qty 0.5

## 2013-11-13 MED ORDER — GADOBENATE DIMEGLUMINE 529 MG/ML IV SOLN
10.0000 mL | Freq: Once | INTRAVENOUS | Status: AC | PRN
  Administered 2013-11-13: 10 mL via INTRAVENOUS

## 2013-11-13 MED ORDER — INSULIN ASPART 100 UNIT/ML ~~LOC~~ SOLN: 0.0000 [IU] | Freq: Every day | SUBCUTANEOUS | Status: DC

## 2013-11-13 MED ORDER — INSULIN ASPART 100 UNIT/ML ~~LOC~~ SOLN
0.0000 [IU] | Freq: Three times a day (TID) | SUBCUTANEOUS | Status: DC
Start: 2013-11-13 — End: 2013-11-17
  Administered 2013-11-14 (×2): 3 [IU] via SUBCUTANEOUS
  Administered 2013-11-15 – 2013-11-16 (×4): 2 [IU] via SUBCUTANEOUS
  Administered 2013-11-16: 3 [IU] via SUBCUTANEOUS
  Administered 2013-11-17 (×2): 2 [IU] via SUBCUTANEOUS

## 2013-11-13 MED ORDER — PNEUMOCOCCAL VAC POLYVALENT 25 MCG/0.5ML IJ INJ
0.5000 mL | INJECTION | INTRAMUSCULAR | Status: DC
  Filled 2013-11-13 (×2): qty 0.5

## 2013-11-13 MED ORDER — DEXAMETHASONE 4 MG PO TABS
4.0000 mg | ORAL_TABLET | Freq: Four times a day (QID) | ORAL | Status: DC
  Administered 2013-11-13 – 2013-11-17 (×16): 4 mg via ORAL
  Filled 2013-11-13 (×19): qty 1

## 2013-11-13 NOTE — Progress Notes (Addendum)
[facility] PROGRESS NOTE  Sonia Weeks BJY:[id number] DOB: 1927/02/12 DOA: 11/12/2013 PCP: Ricke Hey, MD   Brief narrative 80 year old female with history of recently diagnosed metastatic cancer of unknown primary with chest x-ray showing a masslike density in the left perihilar region with a follow-up CT scan of the chest abdomen and pelvis showing several nodules and lesions in the liver, adrenal glands, left kidney and ovaries with extensive lymphadenopathy in the abdomen presented to the ED with a fall and right hip pain. Patient found to have nondisplaced right greater trochanteric fracture. Hospitalist admission requested.   Assessment/Plan: Nondisplaced Fracture of the greater trochanter of the right hip Secondary to mechanical fall and possibly pathological given recently diagnosed metastatic cancer. Supportive care with pain control. Add bowel regimen. Dr Tonita Cong and orthopedics was consulted by ED and reportedly recommended medical management and PT as tolerated. -PT/OT eval and DVT prophylaxis  Metastatic cancer of unknown primary Patient following with Dr. Julien Nordmann as outpatient. He had scheduled for staging as outpatient this week. MRI of the brain done showing innumerable widespread intracranial metastatic deposits without any vasogenic edema or midline shift. Also shows acute bilateral maxillary sinus and ethmoid disease along with cervical lymphadenopathy is also subcentimeter foci of acute hemorrhage associated with some of the lesion. -spoke with Dr Lona Kettle who is covering Dr. Earlie Server. Recommends radiation oncology consult. Spoke with Dr [name] who will see pt. Recommends starting on decadron 4mg  q 6 hr.  -Biopsy of the cervical lymph node scheduled today as outpatient by Dr. Barry Dienes. i have spoken with her and recommends inpatient surgery consult. ( called)  generalised weakness with failure to thrive and moderate malnutrition Likely secondary to recently  diagnosed metastatic cancer. Patient has poor by mouth intake with significant weight loss. Nutrition consult appreciated -Added Ensure twice a day and Megace  Type 2 diabetes mellitus Monitor on sliding scale insulin  Hypertension Continue home therapy unable  Transaminitis Likely  secondary to metastases to the liver. monitor for now  Tobacco use Counseled on suggestion. Continue nicotine patch  DVT prophylaxis: Subcutaneous Lovenox  GI prophylaxis: PPI  Code Status: DO NOT RESUSCITATE Family Communication: None at bedside.spoke with daughter on the phone. She is agreeable with palliative care discussion if the need and if Dr Julien Nordmann decides on the same. Disposition Plan: Currently inpatient   Consultants:  Orthopedics ( Dr Tonita Cong consulted by ED and apparently recommended medical management.)  Oncology ( Dr Lona Kettle)  radiation oncology (Dr. [name])  Procedures:  None  Antibiotics: None   HPI/Subjective: Patient seen and examined this morning. Reports pain in her right hip and upset about things getting worse for her after being diagnosed of  cancer  Objective: Filed Vitals:   11/13/13 1304  BP: 163/67  Pulse: 80  Temp: 98.2 F (36.8 C)  Resp: 16    Intake/Output Summary (Last 24 hours) at 11/13/13 1428 Last data filed at 11/13/13 1300  Gross per 24 hour  Intake 2319.58 ml  Output      0 ml  Net 2319.58 ml   Filed Weights   11/12/13 1807  Weight: 57.1 kg (125 lb 14.1 oz)    Exam:   General:  Elderly thin built female in no acute distress  HEENT: No pallor, moist oral mucosa, large left parotic swelling, palpable 0.5 cm left cervical lymph node  Chest: Clear to auscultation bilaterally, no added sounds  Cardiovascular: Normal S1 and S2, no murmurs  Abdomen: Soft, nondistended, nontender, bowel sounds present  Musculoskeletal: Warm,  limited ROM of right hip due to pain. Palpable left supraclavicular, left anterior chest wall, and right   inguinal lymph node (each measuring about 0.5 cm)  CNS: Alert and oriented, nonfocal  Data Reviewed: Basic Metabolic Panel:  Recent Labs Lab 11/12/13 1114 11/12/13 1810 11/13/13 0345  NA 138  --  135*  K 4.2  --  3.9  CL 98  --  98  CO2 27  --  24  GLUCOSE 116*  --  101*  BUN 15  --  12  CREATININE 0.86 0.84 0.78  CALCIUM 9.4  --  8.7   Liver Function Tests:  Recent Labs Lab 11/12/13 1114 11/13/13 0345  AST 213* 185*  ALT 136* 156*  ALKPHOS 294* 301*  BILITOT 1.0 1.8*  PROT 8.4* 7.0  ALBUMIN 3.1* 2.4*   No results found for this basename: LIPASE, AMYLASE,  in the last 168 hours No results found for this basename: AMMONIA,  in the last 168 hours CBC:  Recent Labs Lab 11/12/13 1114 11/12/13 1810 11/13/13 0345  WBC 9.2 8.1 7.7  NEUTROABS 6.4  --   --   HGB 12.7 11.3* 11.1*  HCT 37.0 33.7* 32.9*  MCV 92.5 93.9 92.7  PLT 268 209 243   Cardiac Enzymes:  Recent Labs Lab 11/12/13 1114  TROPONINI <0.30   BNP (last 3 results)  Recent Labs  10/16/13 1320  PROBNP 1161.0*   CBG:  Recent Labs Lab 11/12/13 2144 11/13/13 0756 11/13/13 1205  GLUCAP 105* 94 136*    No results found for this or any previous visit (from the past 240 hour(s)).   Studies: Dg Hip Complete Right  11/12/2013   CLINICAL DATA:  Patient fell in her home this morning injuring right hip. Right hip pain.  EXAM: RIGHT HIP - COMPLETE 2+ VIEW  COMPARISON:  None.  FINDINGS: No fracture. Hip joint is normally aligned. Bones are diffusely demineralized.  Pelvis is intact. SI joints and symphysis pubis are normally aligned.  Bones are diffusely demineralized.  Soft tissues are unremarkable.  IMPRESSION: No fracture or dislocation.   Electronically Signed   By: Lajean Manes M.D.   On: 11/12/2013 11:39   Ct Head Wo Contrast  11/12/2013   CLINICAL DATA:  Status post fall is more known with injury/laceration.  EXAM: CT HEAD WITHOUT CONTRAST  TECHNIQUE: Contiguous axial images were obtained  from the base of the skull through the vertex without intravenous contrast.  COMPARISON:  CT neck 01/13/2011  FINDINGS: Ventricles are normal in size. Negative for intra or extra-axial hemorrhage, mass effect, mass lesion, or evidence of acute cortically based infarction. Mild chronic microvascular ischemic changes in the periventricular white matter bilaterally. Dural calcifications noted near the vertex.  Extensive sinus disease noted with frothy secretions and air-fluid levels in both maxillary sinuses and scattered fluid in the ethmoid air cells. Visualized mastoid air cells are clear. Inferior aspect of the right mastoid air cells not included on the images.  Skull is somewhat thickened. Negative for skull fracture. Mandibular condyles located.  Soft tissues of the scalp are symmetric and there is no evidence of scalp hematoma.  IMPRESSION: Acute appearing bilateral axillary and ethmoid sinusitis.  Negative for scalp hematoma or skull fracture.  No acute intracranial abnormality.   Electronically Signed   By: Curlene Dolphin M.D.   On: 11/12/2013 11:37   Mr Jeri Cos MP Contrast  11/13/2013   CLINICAL DATA:  Staging of widely metastatic disease of unknown primary, pending tissue diagnosis.  EXAM: MRI HEAD WITHOUT AND WITH CONTRAST  TECHNIQUE: Multiplanar, multiecho pulse sequences of the brain and surrounding structures were obtained without and with intravenous contrast.  CONTRAST:  84mL MULTIHANCE GADOBENATE DIMEGLUMINE 529 MG/ML IV SOLN  COMPARISON:  CT head 11/12/2013.  FINDINGS: There are innumerable widespread intracranial metastatic deposits which are concentrated in the posterior fossa. The largest lesion involves the RIGHT cerebellar hemisphere, and is surrounded by mild vasogenic edema with slight mass effect on the fourth ventricle. It measures 21 x 22 x 20 mm (R-L x A-P x C-C). The largest brainstem lesion is seen in the LEFT paramedian pons measuring 15 x 13 x 13 mm. Extensive widespread  supratentorial metastatic lesions are largely subcentimeter in size. The majority lesions show contrast enhancement with central necrosis although some are solid.  Subcentimeter foci of presumed acute hemorrhage (or melanin) is associated with only a small proportion of lesions, including the LEFT paramedian pontine lesion, the LEFT cerebellar peripheral hemispheric lesion, a LEFT frontal subependymal lesion near the caudate, as well as a LEFT posterior frontal cortical lesion.  Generalized atrophy. Chronic microvascular ischemic change in addition to T2 and FLAIR hyperintensity surrounding the multiple metastatic deposits. No tonsillar herniation. No pituitary lesion. No impending hydrocephalus. No midline shift. Flow voids are maintained. Negative orbits. Acute BILATERAL maxillary sinus and ethmoid disease. Slight heterogeneity of marrow signal without focal osseous lesion. No definite meningeal enhancement.  There is bulky cervical lymphadenopathy incompletely evaluated. As seen on coronal imaging, presumed lymphadenopathy is intimately involved with both the LEFT and RIGHT parotid glands. BILATERAL parotid masses are not excluded, but given the findings on chest and abdomen CT, cervical lymphadenopathy is favored.  IMPRESSION: Innumerable widespread intracranial metastatic deposits as described. Posterior fossa lesions predominate, with potential for obstructive hydrocephalus should the RIGHT cerebellar or LEFT paramedian pontine lesions significant enlarge.  Subcentimeter foci of presumed acute hemorrhage are associated with a minority of lesions.  Incompletely evaluated bulky cervical adenopathy.   Electronically Signed   By: Rolla Flatten M.D.   On: 11/13/2013 08:20   Ct Hip Right Wo Contrast  11/12/2013   CLINICAL DATA:  Fall, right hip pain  EXAM: CT OF THE RIGHT HIP WITHOUT CONTRAST  TECHNIQUE: Multidetector CT imaging of the right hip was performed according to the standard protocol. Multiplanar CT  image reconstructions were also generated.  COMPARISON:  Right hip radiographs dated 11/12/2013  FINDINGS: Nondisplaced fracture involving the right greater trochanter (series 4/image 52).  Right femoral neck is intact.  Mild degenerative changes of the right hip.  Visualized pelvic soft tissues are notable for vascular calcifications but are otherwise within normal limits.  IMPRESSION: Nondisplaced fracture involving the right greater trochanter.   Electronically Signed   By: Julian Hy M.D.   On: 11/12/2013 15:30   Chest Portable 1 View  11/12/2013   CLINICAL DATA:  Smoker, preoperative evaluation for hip fracture. Personal history hypertension, diabetes, smoking, metastatic cancer  EXAM: PORTABLE CHEST - 1 VIEW  COMPARISON:  Portable exam 1759 hr compared to 10/16/2013  FINDINGS: Normal heart size, mediastinal contours and pulmonary vascularity.  Atherosclerotic calcification aortic arch.  LEFT perihilar mass again identified, poorly defined, appears slightly more prominent versus prior study though this may be related to AP versus PA technique.  Few poorly defined pulmonary nodules are questioned in the lower RIGHT lung.  Underlying emphysematous changes.  No pleural effusion or pneumothorax.  Bones demineralized.  IMPRESSION: Emphysematous changes with persistent RIGHT perihilar mass in question subtle nodular densities  in the lower RIGHT lung.   Electronically Signed   By: Lavonia Dana M.D.   On: 11/12/2013 18:29    Scheduled Meds: . docusate sodium  100 mg Oral BID  . enoxaparin (LOVENOX) injection  40 mg Subcutaneous Q24H  . feeding supplement (ENSURE COMPLETE)  237 mL Oral BID BM  . [START ON 11/14/2013] Influenza vac split quadrivalent PF  0.5 mL Intramuscular Tomorrow-1000  . insulin aspart  0-9 Units Subcutaneous TID WC  . megestrol  40 mg Oral Daily  . nicotine  14 mg Transdermal Daily  . pantoprazole  40 mg Oral Q0600  . [START ON 11/14/2013] pneumococcal 23 valent vaccine  0.5 mL  Intramuscular Tomorrow-1000  . verapamil  180 mg Oral QHS   Continuous Infusions: . sodium chloride 75 mL/hr at 11/13/13 1236      Time spent: 25 minutes    Elyanna Wallick, [place]  [facility] Pager [phone]. If 7PM-7AM, please contact night-coverage at [email or web address], password [facility] 11/13/2013, 2:28 PM  LOS: 1 day

## 2013-11-13 NOTE — Plan of Care (Signed)
Problem: Phase I Progression Outcomes Goal: Other Phase I Outcomes/Goals Outcome: Progressing Patient requested pulse ox be removed this am (around 0640). O2 sats have been between 92-96% when observed on machine and parameters set to alarm if <90%. Had been no alarms tonight. So removed pulse per patient request.

## 2013-11-13 NOTE — Consult Note (Signed)
Radiation Oncology         ([phone]) [phone] ________________________________  Initial inpatient Consultation  Name: Sonia Weeks MRN: [id number]  Date: 11/12/2013  DOB: [birth year]  HT:DSKAJGOT, Gwynneth Munson, MD  No ref. provider found   REFERRING PHYSICIAN: No ref. provider found  DIAGNOSIS: 80 yo woman with numerous brain metastases pending biopsy to confirm malignancy  HISTORY OF PRESENT ILLNESS::Sonia Weeks is a 80 y.o. female who initially presented complaining of increasing fatigue and weakness. She was evaluated at the emergency Department at [facility]. Chest x-ray on 10/16/2013 showed an abnormal masslike density in the left perihilar region.    This was followed by CT scan of the chest, abdomen and pelvis which showed several findings including lymphadenopathy in the chest. Index left axillary node measures 1.8 x 1.0 cm. A precarinal node, measures 2.7 x 2.4 cm. Right infrahilar node measures 1.4 x 1.6 cm. AP window node measures 2.4 x 1.7 cm. A left upper lobe mass in the hilum measures 2.7 x 3.2 cm in addition to other small nodules in the right middle lobe and right lung base contiguous with the pleura measuring 1.3 x 0.8 CM. In the abdomen there was also hypoattenuating lesion in the right hepatic lobe measuring 1.7 CM and a second hypoattenuating lesion measuring 0.6 CM. There was also bilateral adrenal gland lesion and a lesion in the left kidney measuring 2.2 x 1.5 CM. There was also extensive lymphadenopathy in the abdomen. The left ovary has an abnormal appearance measuring 2.8 x 4.0 cm. The right ovary is also somewhat prominent measuring 1.9 x 3.0 cm. The colon is unremarkable.     The patient was seen by Dr. Julien Nordmann for evaluation and recommendation regarding her condition. He set up for PET, brain MRI and general surgery consult to get biopsy.  However, prior to completing those, she returned to the hospital with a fall and nondisplaced right greater trochanter  fracture.     Brain MRI shows innumerable brain metastases:    She has been referred for possible palliative whole brain radiation.  She is being seen by surgery for possible biopsy tomorrow.   Marland Kitchen  PREVIOUS RADIATION THERAPY: No  PAST MEDICAL HISTORY:  has a past medical history of Hypertension; Diabetes mellitus; Arthritis; and Metastatic cancer (10/26/2013).    PAST SURGICAL HISTORY: Past Surgical History  Procedure Laterality Date  . Ectopic pregnancy surgery      FAMILY HISTORY: family history is not on file.  SOCIAL HISTORY:  reports that she has been smoking.  She does not have any smokeless tobacco history on file. She reports that she does not drink alcohol or use illicit drugs.  ALLERGIES: Review of patient's allergies indicates no known allergies.  MEDICATIONS:  Current Facility-Administered Medications  Medication Dose Route Frequency Provider Last Rate Last Dose  . 0.9 %  sodium chloride infusion   Intravenous Continuous Eugenie Filler, MD 75 mL/hr at 11/13/13 1236    . albuterol (PROVENTIL) (2.5 MG/3ML) 0.083% nebulizer solution 2.5 mg  2.5 mg Nebulization Q6H PRN Eugenie Filler, MD      . docusate sodium (COLACE) capsule 100 mg  100 mg Oral BID Eugenie Filler, MD   100 mg at 11/13/13 0953  . enoxaparin (LOVENOX) injection 40 mg  40 mg Subcutaneous Q24H Eugenie Filler, MD      . feeding supplement (ENSURE COMPLETE) (ENSURE COMPLETE) liquid 237 mL  237 mL Oral BID BM Eugenie Filler, MD   [phone]  mL at 11/13/13 0954  . HYDROcodone-acetaminophen (NORCO/VICODIN) 5-325 MG per tablet 1-2 tablet  1-2 tablet Oral Q6H PRN Eugenie Filler, MD   2 tablet at 11/13/13 [phone]  . ibuprofen (ADVIL,MOTRIN) tablet 400 mg  400 mg Oral Q4H PRN Eugenie Filler, MD      . Derrill Memo ON 11/14/2013] Influenza vac split quadrivalent PF (FLUARIX) injection 0.5 mL  0.5 mL Intramuscular Tomorrow-1000 Nishant Dhungel, MD      . insulin aspart (novoLOG) injection 0-9 Units  0-9 Units  Subcutaneous TID WC Eugenie Filler, MD   1 Units at 11/13/13 1235  . ipratropium-albuterol (DUONEB) 0.5-2.5 (3) MG/3ML nebulizer solution 3 mL  3 mL Nebulization Q4H PRN Eugenie Filler, MD      . megestrol (MEGACE) tablet 40 mg  40 mg Oral Daily Eugenie Filler, MD   40 mg at 11/13/13 0953  . methocarbamol (ROBAXIN) tablet 500 mg  500 mg Oral Q6H PRN Eugenie Filler, MD       Or  . methocarbamol (ROBAXIN) 500 mg in dextrose 5 % 50 mL IVPB  500 mg Intravenous Q6H PRN Irine Seal V, MD      . morphine 2 MG/ML injection 0.5 mg  0.5 mg Intravenous Q2H PRN Eugenie Filler, MD      . naproxen (NAPROSYN) tablet 500 mg  500 mg Oral BID PRN Eugenie Filler, MD      . nicotine (NICODERM CQ - dosed in mg/24 hours) patch 14 mg  14 mg Transdermal Daily Eugenie Filler, MD   14 mg at 11/13/13 0955  . ondansetron (ZOFRAN) injection 4 mg  4 mg Intravenous Q6H PRN Eugenie Filler, MD      . pantoprazole (PROTONIX) EC tablet 40 mg  40 mg Oral Q0600 Eugenie Filler, MD   40 mg at 11/13/13 [phone]  . [START ON 11/14/2013] pneumococcal 23 valent vaccine (PNU-IMMUNE) injection 0.5 mL  0.5 mL Intramuscular Tomorrow-1000 Nishant Dhungel, MD      . polyethylene glycol (MIRALAX / GLYCOLAX) packet 17 g  17 g Oral Daily PRN Irine Seal V, MD      . sorbitol 70 % solution 30 mL  30 mL Oral Daily PRN Eugenie Filler, MD      . verapamil (CALAN-SR) CR tablet 180 mg  180 mg Oral QHS Eugenie Filler, MD   180 mg at 11/12/13 2146  . zolpidem (AMBIEN) tablet 5 mg  5 mg Oral QHS PRN Eugenie Filler, MD        REVIEW OF SYSTEMS:  A 15 point review of systems is documented in the electronic medical record. This was obtained by the nursing staff. However, I reviewed this with the patient to discuss relevant findings and make appropriate changes.  Pertinent items are noted in HPI.   PHYSICAL EXAM:  height is 5\' 2"  (1.575 m) and weight is 125 lb 14.1 oz (57.1 kg). Her oral temperature is 98.2 F (36.8 C).  Her blood pressure is 163/67 and her pulse is 80. Her respiration is 16 and oxygen saturation is 95%.   Per surgery Constitutional: She is oriented to person, place, and time. She appears well-developed and well-nourished. No distress.  Elderly female, in no distress.  HENT:  Head: Normocephalic and atraumatic.  Nose: Nose normal.  Eyes: Right eye exhibits no discharge. Left eye exhibits no discharge. No scleral icterus.  Neck: Normal range of motion. Neck supple. No JVD present. No tracheal deviation present.  No thyromegaly present.  She has large post auricular nodes/parotids on both sides. 8 x 4.5 cm on the right. 10 x 7.5 on the left. Bilateral supraclavicular lymph nodes about 1 cm in diameter. Bilateral axillary nodes. Nodes and and parotid are not tender, all are hard. Cardiovascular: Normal rate, regular rhythm, normal heart sounds and intact distal pulses. Exam reveals no gallop.  No murmur heard.  Respiratory: Effort normal and breath sounds normal. No respiratory distress. She has no wheezes. She has no rales. She exhibits no tenderness.  1 cm sub xyphoid lymphnode. None of the nodes are painful, all hard and rubbery.  GI: Soft. She exhibits no distension and no mass. There is no tenderness. There is no rebound and no guarding.  She has a midline lower abdominal hernia from prior ectopic pregnancy and abdominal lymph nodes also. non tender nodes.  Musculoskeletal: She exhibits no edema and no tenderness.  Lymphadenopathy:  She has no cervical adenopathy.  Neurological: She is alert and oriented to person, place, and time. No cranial nerve deficit.  She is legally blind with ongoing decreased vision.  Skin: Skin is warm and dry. No rash noted. She is not diaphoretic. No erythema. No pallor.  Psychiatric: She has a normal mood and affect. Her behavior is normal. Judgment and thought content normal.    KPS = 30  100 - Normal; no complaints; no evidence of disease. 90   - Able  to carry on normal activity; minor signs or symptoms of disease. 80   - Normal activity with effort; some signs or symptoms of disease. 41   - Cares for self; unable to carry on normal activity or to do active work. 60   - Requires occasional assistance, but is able to care for most of his personal needs. 50   - Requires considerable assistance and frequent medical care. 14   - Disabled; requires special care and assistance. 86   - Severely disabled; hospital admission is indicated although death not imminent. 65   - Very sick; hospital admission necessary; active supportive treatment necessary. 10   - Moribund; fatal processes progressing rapidly. 0     - Dead  Karnofsky DA, Abelmann Alvin, Craver LS and Burchenal [facility] [phone]) The use of the nitrogen mustards in the palliative treatment of carcinoma: with particular reference to bronchogenic carcinoma Cancer 1 [id number]  LABORATORY DATA:  Lab Results  Component Value Date   WBC 7.7 11/13/2013   HGB 11.1* 11/13/2013   HCT 32.9* 11/13/2013   MCV 92.7 11/13/2013   PLT 243 11/13/2013   Lab Results  Component Value Date   NA 135* 11/13/2013   K 3.9 11/13/2013   CL 98 11/13/2013   CO2 24 11/13/2013   Lab Results  Component Value Date   ALT 156* 11/13/2013   AST 185* 11/13/2013   ALKPHOS 301* 11/13/2013   BILITOT 1.8* 11/13/2013     RADIOGRAPHY: Dg Chest 2 View  10/16/2013   CLINICAL DATA:  Shortness of breath and chest tightness for 1 week also abdominal pain and diarrhea; history of tobacco use, hypertension, and diabetes.  EXAM: CHEST  2 VIEW  COMPARISON:  PA and lateral chest of January 13, 2011  FINDINGS: There is an abnormal masslike density in the left perihilar region new since the previous study. The lungs are otherwise adequately inflated and clear. The heart and pulmonary vascularity are normal. There is degenerative change of the left shoulder. There is curvature of the thoracolumbar spine with degenerative  disc change at multiple  levels.  IMPRESSION: There is an abnormal masslike density in the left perihilar region. Chest CT scanning now is recommended.  These results will be called to the ordering clinician or representative by the Radiologist Assistant, and communication documented in the PACS or zVision Dashboard.   Electronically Signed   By: David  Martinique   On: 10/16/2013 13:53   Dg Hip Complete Right  11/12/2013   CLINICAL DATA:  Patient fell in her home this morning injuring right hip. Right hip pain.  EXAM: RIGHT HIP - COMPLETE 2+ VIEW  COMPARISON:  None.  FINDINGS: No fracture. Hip joint is normally aligned. Bones are diffusely demineralized.  Pelvis is intact. SI joints and symphysis pubis are normally aligned.  Bones are diffusely demineralized.  Soft tissues are unremarkable.  IMPRESSION: No fracture or dislocation.   Electronically Signed   By: Lajean Manes M.D.   On: 11/12/2013 11:39   Ct Head Wo Contrast  11/12/2013   CLINICAL DATA:  Status post fall is more known with injury/laceration.  EXAM: CT HEAD WITHOUT CONTRAST  TECHNIQUE: Contiguous axial images were obtained from the base of the skull through the vertex without intravenous contrast.  COMPARISON:  CT neck 01/13/2011  FINDINGS: Ventricles are normal in size. Negative for intra or extra-axial hemorrhage, mass effect, mass lesion, or evidence of acute cortically based infarction. Mild chronic microvascular ischemic changes in the periventricular white matter bilaterally. Dural calcifications noted near the vertex.  Extensive sinus disease noted with frothy secretions and air-fluid levels in both maxillary sinuses and scattered fluid in the ethmoid air cells. Visualized mastoid air cells are clear. Inferior aspect of the right mastoid air cells not included on the images.  Skull is somewhat thickened. Negative for skull fracture. Mandibular condyles located.  Soft tissues of the scalp are symmetric and there is no evidence of scalp hematoma.  IMPRESSION: Acute  appearing bilateral axillary and ethmoid sinusitis.  Negative for scalp hematoma or skull fracture.  No acute intracranial abnormality.   Electronically Signed   By: Curlene Dolphin M.D.   On: 11/12/2013 11:37   Ct Chest W Contrast  10/16/2013   CLINICAL DATA:  Diffuse abdominal pain. Low back pain. Diarrhea and shortness of breath. Symptoms for 1 week.  EXAM: CT CHEST, ABDOMEN, AND PELVIS WITH CONTRAST  TECHNIQUE: Multidetector CT imaging of the chest, abdomen and pelvis was performed following the standard protocol during bolus administration of intravenous contrast.  CONTRAST:  100 mL OMNIPAQUE IOHEXOL 300 MG/ML  SOLN  COMPARISON:  PA and lateral chest earlier this same day.  FINDINGS: CT CHEST FINDINGS  There is lymphadenopathy in the chest. Index left axillary node measures 1.8 x 1.0 cm on image 33. A precarinal node on image 27 measures 2.7 x 2.4 cm. Right infrahilar node measures 1.4 x 1.6 cm on image 34. AP window node on image 25 measures 2.4 x 1.7 cm. There is no pleural or pericardial effusion. Calcific aortic and coronary atherosclerosis is identified.  Lungs demonstrate emphysematous disease. A left upper lobe mass in the hilum measures 2.7 x 3.2 cm on image 28. A 0.3 cm right middle lobe nodule is seen on image 30. Subpleural nodule also in the right middle lobe on images 36 measures 0.8 cm. A nodule in the right lung base which appears to be contiguous with the pleura measures 1.3 x 0.8 cm on image 44. Subtle nodularity along lymphatics about the hila may represent lymphangitic tumor spread. No focal bony  abnormality is identified.  CT ABDOMEN AND PELVIS FINDINGS  There is a hypoattenuating lesion in the right hepatic lobe measuring 1.7 cm on image 51. A second hypoattenuating lesion measuring 0.6 cm is identified on image 66. The gallbladder and spleen are unremarkable. There are bilateral adrenal lesions. On the right, the adrenal gland measures 2.1 cm transverse by 2.5 cm AP by 2.7 cm  craniocaudal. The left adrenal gland measures 1.9 cm craniocaudal by 1.6 cm transverse by 2.6 cm AP. There is a lesion in the left kidney measuring 2.2 x 1.5 cm on image 18 which is indeterminate. An indeterminate focus of decreased attenuation in the midpole left kidney measures 1.7 by 1.4 cm on image 12. Small renal cysts are noted. There is dilatation of the pancreatic duct in the pancreatic neck and proximal body. The duct in the remainder of the pancreatic body and tail is appears normal. No discrete pancreatic lesion is seen.  There is extensive lymphadenopathy in the abdomen. A necrotic celiac axis node on image 58 measures 1.5 cm short axis dimension. A necrotic node posterior to the portal vein measures 2.4 x 1.7 cm on image 64. Innumerable abnormal nodules are seen in the retroperitoneum. Index lesion along the lower pole the right kidney measures 1.1 cm on image 76. Multiple subcutaneous nodules are also seen. Index lesion on the left side of the abdomen measures 1.1 cm on image 76.  The uterus and urinary bladder appear normal. The left ovary has an abnormal appearance measuring 2.8 x 4.0 cm. The right ovary is also somewhat prominent measuring 1.9 x 3.0 cm. The colon is unremarkable. The stomach and small bowel appear normal. There is a fat containing midline hernia in the pelvis. An abnormal nodule within herniated fat measures 0.6 cm on image 106. No focal bony abnormality is identified.  IMPRESSION: Findings consistent with metastatic carcinoma with lymphadenopathy in the chest and abdomen, a left hilar mass, subcutaneous nodules, liver and adrenal lesions. The primary lesion is not discretely identified but it may be the patient's left hilar mass.  Two indeterminate left renal lesions have an appearance worrisome for carcinoma either primary or metastatic.  Pancreatic ductal dilatation in the proximal body and neck without discrete pancreatic lesion identified. The could be an occult pancreatic  neoplasm or the patient may have had some prior inflammatory process causing pancreatic ductal dilatation.  Abnormally prominent left ovary may be secondary to a cystic mass. Pelvic ultrasound could be used for further evaluation. It is doubtful this represents the patient's primary lesion.   Electronically Signed   By: Inge Rise M.D.   On: 10/16/2013 15:38   Mr Sonia Weeks VP Contrast  11/13/2013   CLINICAL DATA:  Staging of widely metastatic disease of unknown primary, pending tissue diagnosis.  EXAM: MRI HEAD WITHOUT AND WITH CONTRAST  TECHNIQUE: Multiplanar, multiecho pulse sequences of the brain and surrounding structures were obtained without and with intravenous contrast.  CONTRAST:  44mL MULTIHANCE GADOBENATE DIMEGLUMINE 529 MG/ML IV SOLN  COMPARISON:  CT head 11/12/2013.  FINDINGS: There are innumerable widespread intracranial metastatic deposits which are concentrated in the posterior fossa. The largest lesion involves the RIGHT cerebellar hemisphere, and is surrounded by mild vasogenic edema with slight mass effect on the fourth ventricle. It measures 21 x 22 x 20 mm (R-L x A-P x C-C). The largest brainstem lesion is seen in the LEFT paramedian pons measuring 15 x 13 x 13 mm. Extensive widespread supratentorial metastatic lesions are largely subcentimeter in  size. The majority lesions show contrast enhancement with central necrosis although some are solid.  Subcentimeter foci of presumed acute hemorrhage (or melanin) is associated with only a small proportion of lesions, including the LEFT paramedian pontine lesion, the LEFT cerebellar peripheral hemispheric lesion, a LEFT frontal subependymal lesion near the caudate, as well as a LEFT posterior frontal cortical lesion.  Generalized atrophy. Chronic microvascular ischemic change in addition to T2 and FLAIR hyperintensity surrounding the multiple metastatic deposits. No tonsillar herniation. No pituitary lesion. No impending hydrocephalus. No  midline shift. Flow voids are maintained. Negative orbits. Acute BILATERAL maxillary sinus and ethmoid disease. Slight heterogeneity of marrow signal without focal osseous lesion. No definite meningeal enhancement.  There is bulky cervical lymphadenopathy incompletely evaluated. As seen on coronal imaging, presumed lymphadenopathy is intimately involved with both the LEFT and RIGHT parotid glands. BILATERAL parotid masses are not excluded, but given the findings on chest and abdomen CT, cervical lymphadenopathy is favored.  IMPRESSION: Innumerable widespread intracranial metastatic deposits as described. Posterior fossa lesions predominate, with potential for obstructive hydrocephalus should the RIGHT cerebellar or LEFT paramedian pontine lesions significant enlarge.  Subcentimeter foci of presumed acute hemorrhage are associated with a minority of lesions.  Incompletely evaluated bulky cervical adenopathy.   Electronically Signed   By: Rolla Flatten M.D.   On: 11/13/2013 08:20   Ct Abdomen Pelvis W Contrast  10/16/2013   CLINICAL DATA:  Diffuse abdominal pain. Low back pain. Diarrhea and shortness of breath. Symptoms for 1 week.  EXAM: CT CHEST, ABDOMEN, AND PELVIS WITH CONTRAST  TECHNIQUE: Multidetector CT imaging of the chest, abdomen and pelvis was performed following the standard protocol during bolus administration of intravenous contrast.  CONTRAST:  100 mL OMNIPAQUE IOHEXOL 300 MG/ML  SOLN  COMPARISON:  PA and lateral chest earlier this same day.  FINDINGS: CT CHEST FINDINGS  There is lymphadenopathy in the chest. Index left axillary node measures 1.8 x 1.0 cm on image 33. A precarinal node on image 27 measures 2.7 x 2.4 cm. Right infrahilar node measures 1.4 x 1.6 cm on image 34. AP window node on image 25 measures 2.4 x 1.7 cm. There is no pleural or pericardial effusion. Calcific aortic and coronary atherosclerosis is identified.  Lungs demonstrate emphysematous disease. A left upper lobe mass in the  hilum measures 2.7 x 3.2 cm on image 28. A 0.3 cm right middle lobe nodule is seen on image 30. Subpleural nodule also in the right middle lobe on images 36 measures 0.8 cm. A nodule in the right lung base which appears to be contiguous with the pleura measures 1.3 x 0.8 cm on image 44. Subtle nodularity along lymphatics about the hila may represent lymphangitic tumor spread. No focal bony abnormality is identified.  CT ABDOMEN AND PELVIS FINDINGS  There is a hypoattenuating lesion in the right hepatic lobe measuring 1.7 cm on image 51. A second hypoattenuating lesion measuring 0.6 cm is identified on image 66. The gallbladder and spleen are unremarkable. There are bilateral adrenal lesions. On the right, the adrenal gland measures 2.1 cm transverse by 2.5 cm AP by 2.7 cm craniocaudal. The left adrenal gland measures 1.9 cm craniocaudal by 1.6 cm transverse by 2.6 cm AP. There is a lesion in the left kidney measuring 2.2 x 1.5 cm on image 18 which is indeterminate. An indeterminate focus of decreased attenuation in the midpole left kidney measures 1.7 by 1.4 cm on image 12. Small renal cysts are noted. There is dilatation of the  pancreatic duct in the pancreatic neck and proximal body. The duct in the remainder of the pancreatic body and tail is appears normal. No discrete pancreatic lesion is seen.  There is extensive lymphadenopathy in the abdomen. A necrotic celiac axis node on image 58 measures 1.5 cm short axis dimension. A necrotic node posterior to the portal vein measures 2.4 x 1.7 cm on image 64. Innumerable abnormal nodules are seen in the retroperitoneum. Index lesion along the lower pole the right kidney measures 1.1 cm on image 76. Multiple subcutaneous nodules are also seen. Index lesion on the left side of the abdomen measures 1.1 cm on image 76.  The uterus and urinary bladder appear normal. The left ovary has an abnormal appearance measuring 2.8 x 4.0 cm. The right ovary is also somewhat prominent  measuring 1.9 x 3.0 cm. The colon is unremarkable. The stomach and small bowel appear normal. There is a fat containing midline hernia in the pelvis. An abnormal nodule within herniated fat measures 0.6 cm on image 106. No focal bony abnormality is identified.  IMPRESSION: Findings consistent with metastatic carcinoma with lymphadenopathy in the chest and abdomen, a left hilar mass, subcutaneous nodules, liver and adrenal lesions. The primary lesion is not discretely identified but it may be the patient's left hilar mass.  Two indeterminate left renal lesions have an appearance worrisome for carcinoma either primary or metastatic.  Pancreatic ductal dilatation in the proximal body and neck without discrete pancreatic lesion identified. The could be an occult pancreatic neoplasm or the patient may have had some prior inflammatory process causing pancreatic ductal dilatation.  Abnormally prominent left ovary may be secondary to a cystic mass. Pelvic ultrasound could be used for further evaluation. It is doubtful this represents the patient's primary lesion.   Electronically Signed   By: Inge Rise M.D.   On: 10/16/2013 15:38   Ct Hip Right Wo Contrast  11/12/2013   CLINICAL DATA:  Fall, right hip pain  EXAM: CT OF THE RIGHT HIP WITHOUT CONTRAST  TECHNIQUE: Multidetector CT imaging of the right hip was performed according to the standard protocol. Multiplanar CT image reconstructions were also generated.  COMPARISON:  Right hip radiographs dated 11/12/2013  FINDINGS: Nondisplaced fracture involving the right greater trochanter (series 4/image 52).  Right femoral neck is intact.  Mild degenerative changes of the right hip.  Visualized pelvic soft tissues are notable for vascular calcifications but are otherwise within normal limits.  IMPRESSION: Nondisplaced fracture involving the right greater trochanter.   Electronically Signed   By: Julian Hy M.D.   On: 11/12/2013 15:30   Chest Portable 1  View  11/12/2013   CLINICAL DATA:  Smoker, preoperative evaluation for hip fracture. Personal history hypertension, diabetes, smoking, metastatic cancer  EXAM: PORTABLE CHEST - 1 VIEW  COMPARISON:  Portable exam 1759 hr compared to 10/16/2013  FINDINGS: Normal heart size, mediastinal contours and pulmonary vascularity.  Atherosclerotic calcification aortic arch.  LEFT perihilar mass again identified, poorly defined, appears slightly more prominent versus prior study though this may be related to AP versus PA technique.  Few poorly defined pulmonary nodules are questioned in the lower RIGHT lung.  Underlying emphysematous changes.  No pleural effusion or pneumothorax.  Bones demineralized.  IMPRESSION: Emphysematous changes with persistent RIGHT perihilar mass in question subtle nodular densities in the lower RIGHT lung.   Electronically Signed   By: Lavonia Dana M.D.   On: 11/12/2013 18:29      IMPRESSION: This patient is a  very pleasant 80 yo woman with numerous brain metastases pending biopsy to confirm malignancy.  She would likely benefit from whole brain radiotherapy with palliative intent.  PLAN:  Today, I discussed the findings and work-up with the patient.  I am in agreement with starting decadron.  I would recommend whole brain radiotherapy, pending biopsy results.  I will follow-up and arrange simulation following biopsy.  I spent 40 minutes minutes face to face with the patient and more than 50% of that time was spent in counseling and/or coordination of care.   ------------------------------------------------  Sheral Apley. [name], M.D.

## 2013-11-13 NOTE — Evaluation (Signed)
Physical Therapy Evaluation Patient Details Name: Sonia Weeks MRN: [id number] DOB: July 24, 1927 Today's Date: 11/13/2013   History of Present Illness  80 yo female admitted with R greater trochanter fx after sustaining fall at home. Hx of HTN, DM, met cancer with primary unknown. MRI 10/26 + for brain mets.   Clinical Impression  Limited eval due to pts impaired cognition and unwillingness to follow therapist's recommendations/instructions. On eval, pt required Mod assist +2 for squat pivot, bed<>BSC. Pt refused to use walker and was non compliant with TDWB instructions. Unable to safely attempt further mobility for this reason. Pt confused during session-A &O x2. Poor safety awareness when mobilizing. Will need SNF for continued rehab to improve functional mobility and safety. Pt is not safe to d/c home.     Follow Up Recommendations SNF    Equipment Recommendations  None recommended by PT    Recommendations for Other Services OT consult     Precautions / Restrictions Precautions Precautions: Fall Restrictions Weight Bearing Restrictions: Yes RLE Weight Bearing: Touchdown weight bearing      Mobility  Bed Mobility Overal bed mobility: Needs Assistance Bed Mobility: Supine to Sit;Sit to Supine     Supine to sit: Min assist Sit to supine: Min assist   General bed mobility comments: Assist for LEs. Increased time. Multimodal cues for safety, technique.   Transfers Overall transfer level: Needs assistance   Transfers: Squat Pivot Transfers     Squat pivot transfers: Mod assist;+2 safety/equipment;+2 physical assistance     General transfer comment: Attempted to have pt stand with use of RW-pt repeatedly refused to use walker despite therapist's instruction/recommendation. Squat pivot, bed<>BSC at pt's request due to need to toilet. +2 for safety, assistance, and monitoring R LE. Pt  insistant on perform task "her way" despite education on TDWB status. Pt non adherent to  WB statuts during pivot back to bed.   Ambulation/Gait                Stairs            Wheelchair Mobility    Modified Rankin (Stroke Patients Only)       Balance Overall balance assessment: History of Falls;Needs assistance         Standing balance support: Bilateral upper extremity supported;During functional activity Standing balance-Leahy Scale: Poor                               Pertinent Vitals/Pain Pain Assessment: Faces Faces Pain Scale: Hurts even more Pain Location: R thigh/hip area Pain Intervention(s): Monitored during session;Limited activity within patient's tolerance;Repositioned    Home Living Family/patient expects to be discharged to:: Private residence Living Arrangements: Alone   Type of Home: House         Home Equipment: Walker - 2 wheels Additional Comments: unsure of full PLOF/home environment info-pt confused on eval    Prior Function Level of Independence: Independent with assistive device(s)               Hand Dominance        Extremity/Trunk Assessment   Upper Extremity Assessment: Defer to OT evaluation             RLE Deficits / Details: Pt able to tolerate movement. Did not officially test.    Cervical / Trunk Assessment: Kyphotic  Communication   Communication: No difficulties  Cognition Arousal/Alertness: Awake/alert Behavior During Therapy: WFL for tasks assessed/performed Overall  Cognitive Status: No family/caregiver present to determine baseline cognitive functioning Area of Impairment: Orientation;Attention;Following commands;Safety/judgement;Awareness;Problem solving Orientation Level: Disoriented to;Place;Time Current Attention Level: Focused Memory: Decreased recall of precautions;Decreased short-term memory Following Commands: Follows one step commands inconsistently Safety/Judgement: Decreased awareness of safety;Decreased awareness of deficits Awareness:  Emergent Problem Solving: Requires tactile cues;Requires verbal cues      General Comments      Exercises        Assessment/Plan    PT Assessment Patient needs continued PT services  PT Diagnosis Difficulty walking;Abnormality of gait;Generalized weakness;Acute pain   PT Problem List Decreased strength;Decreased range of motion;Decreased activity tolerance;Decreased balance;Decreased mobility;Decreased knowledge of precautions;Decreased safety awareness;Decreased knowledge of use of DME;Decreased cognition;Pain  PT Treatment Interventions DME instruction;Gait training;Functional mobility training;Therapeutic activities;Therapeutic exercise;Balance training   PT Goals (Current goals can be found in the Care Plan section) Acute Rehab PT Goals Patient Stated Goal: to go home PT Goal Formulation: Patient unable to participate in goal setting Time For Goal Achievement: 11/27/13 Potential to Achieve Goals: Fair    Frequency Min 3X/week   Barriers to discharge        Co-evaluation               End of Session Equipment Utilized During Treatment: Gait belt Activity Tolerance: Patient tolerated treatment well Patient left: in bed;with call bell/phone within reach;with bed alarm set           Time: 0850-0909 PT Time Calculation (min): 19 min   Charges:   PT Evaluation $Initial PT Evaluation Tier I: 1 Procedure PT Treatments $Therapeutic Activity: 8-22 mins   PT G Codes:          Weston Anna, MPT Pager: [phone]

## 2013-11-13 NOTE — Care Management Note (Signed)
CARE MANAGEMENT NOTE 11/13/2013  Patient:  Sonia Weeks, Sonia Weeks   Account Number:  [id number]  Date Initiated:  11/13/2013  Documentation initiated by:  Marney Doctor  Subjective/Objective Assessment:   80 yo admitted with fracture of greater trochanter of right femur. New diagnosis of Metastatic CA     Action/Plan:   From home alone   Anticipated DC Date:  11/17/2013   Anticipated DC Plan:  [place] referral  Clinical Social Worker      DC Planning Services  CM consult      [facility] [facility]  HOME HEALTH   Choice offered to / List presented to:  C-4 Adult Children           Status of service:  In process, will continue to follow Medicare Important Message given?   (If response is "NO", the following Medicare IM given date fields will be blank) Date Medicare IM given:   Medicare IM given by:   Date Additional Medicare IM given:   Additional Medicare IM given by:    Discharge Disposition:    Per UR Regulation:  Reviewed for med. necessity/level of care/duration of stay  If discussed at [place] of Stay Meetings, dates discussed:    Comments:  11/13/13 Marney Doctor RN,BSN,NCM Met with pt and family and CSW to discuss DC needs.  Pt open to SNF option but family would really like to take her home.  Family to discuss options and this CM will check back.  [place] agency list and private care list provided for their information.  Will continue to follow.

## 2013-11-13 NOTE — Progress Notes (Signed)
INITIAL NUTRITION ASSESSMENT  DOCUMENTATION CODES Per approved criteria  -Non-severe (moderate) malnutrition in the context of chronic illness   INTERVENTION: Regular diet Ensure Complete po BID, each supplement provides 350 kcal and 13 grams of protein Encouraged intake RD to follow.  NUTRITION DIAGNOSIS: Inadequate oral intake related to decreased appetite as evidenced by observation and patient report.   Goal: Intake of meals and supplements to meet >90% estimated needs.    Monitor:  Intake, labs, weight trend  Reason for Assessment: Consult-hip protocol  80 y.o. female  Admitting Dx: Fracture of greater trochanter of right femur  ASSESSMENT: Patient with hx of HTN and DM, met cancer with primary unknown MRI 10/26 positive for brain mets admitted with right greater trochanter fx after fall at home.  Per PT, patient is not safe to return to home with recommendations for SNF.  10/26: -Patient with impaired cognition. Adamant about returning home today. -Patient reports no appetite for the past 2 weeks but had been forcing herself to eat at home.  Daughter and friends had been providing food.   -"Refrigerator full of Ensure so I don't need it here."  States trying to drink 3 daily prior to admit.   -Poor intake currently with only about 25% breakfast this am observed. -UBW 143 lbs "10 months ago"  Weight loss of 18 lbs (12%) during this time period  Nutrition Focused Physical Exam:  Subcutaneous Fat:  Orbital Region:  mild Upper Arm Region: mild/moderate Thoracic and Lumbar Region: n/a  Muscle:  Temple Region: severe Clavicle Bone Region: severe Clavicle and Acromion Bone Region: mild/moderate Scapular Bone Region: mild Dorsal Hand: severe Patellar Region:moderate Anterior Thigh Region: moderate Posterior Calf Region: moderate  Edema: not noted       Height: Ht Readings from Last 1 Encounters:  11/12/13 5' 2"  (1.575 m)    Weight: Wt Readings from Last  1 Encounters:  11/12/13 125 lb 14.1 oz (57.1 kg)    Ideal Body Weight: 110 lbs % Ideal Body Weight: 136  Wt Readings from Last 10 Encounters:  11/12/13 125 lb 14.1 oz (57.1 kg)  10/26/13 128 lb 6.4 oz (58.242 kg)  01/13/11 158 lb 11.7 oz (72 kg)    Usual Body Weight: 143 lbs  % Usual Body Weight: 88  BMI:  Body mass index is 23.02 kg/(m^2).  Estimated Nutritional Needs: Kcal: 1600-1800 Protein: 70-80 gm Fluid: >1.5L daily  Skin: intact  Diet Order: General  EDUCATION NEEDS: -Education needs addressed   Intake/Output Summary (Last 24 hours) at 11/13/13 1159 Last data filed at 11/13/13 1100  Gross per 24 hour  Intake 2199.58 ml  Output      0 ml  Net 2199.58 ml    Labs:   Recent Labs Lab 11/12/13 1114 11/12/13 1810 11/13/13 0345  NA 138  --  135*  K 4.2  --  3.9  CL 98  --  98  CO2 27  --  24  BUN 15  --  12  CREATININE 0.86 0.84 0.78  CALCIUM 9.4  --  8.7  GLUCOSE 116*  --  101*    CBG (last 3)   Recent Labs  11/12/13 2144 11/13/13 0756  GLUCAP 105* 94    Scheduled Meds: . docusate sodium  100 mg Oral BID  . enoxaparin (LOVENOX) injection  40 mg Subcutaneous Q24H  . feeding supplement (ENSURE COMPLETE)  237 mL Oral BID BM  . [START ON 11/14/2013] Influenza vac split quadrivalent PF  0.5 mL Intramuscular  Tomorrow-1000  . insulin aspart  0-9 Units Subcutaneous TID WC  . megestrol  40 mg Oral Daily  . nicotine  14 mg Transdermal Daily  . pantoprazole  40 mg Oral Q0600  . [START ON 11/14/2013] pneumococcal 23 valent vaccine  0.5 mL Intramuscular Tomorrow-1000  . verapamil  180 mg Oral QHS    Continuous Infusions: . sodium chloride 75 mL/hr at 11/12/13 1845    Past Medical History  Diagnosis Date  . Hypertension   . Diabetes mellitus   . Arthritis   . Metastatic cancer 10/26/2013    Past Surgical History  Procedure Laterality Date  . Ectopic pregnancy surgery      Antonieta Iba, RD, LDN Clinical Inpatient Dietitian Pager:   [phone] Weekend and after hours pager:  [phone]

## 2013-11-13 NOTE — Progress Notes (Signed)
Held lovenox tonight. ? Biopsy per MD note. Patient thinks she is going to have one.

## 2013-11-13 NOTE — [facility] Oncology Navigator Note (Unsigned)
Daughter called and left me a voice mail message patient had been admitted.  I called Dr. Marlowe Aschoff office to cancel patient's appt for today.  I called daughter back. She would like biopsy while patient is in the hospital. I stated Dr. Julien Nordmann is out until Wednesday but that I would notify him of request.  She was thankful for the call back.

## 2013-11-13 NOTE — Progress Notes (Signed)
OT Cancellation Note  Patient Details Name: Sonia Weeks MRN: [id number] DOB: Aug 01, 1927   Cancelled Treatment:    Reason Eval/Treat Not Completed: Other (comment) Pt on bedrest. Please update activity orders when appropriate and also note WBS RLE. Thank you.   [place], OTR/L  [phone] 11/13/2013 11/13/2013, 7:42 AM

## 2013-11-13 NOTE — Plan of Care (Signed)
Problem: Phase I Progression Outcomes Goal: OOB as tolerated unless otherwise ordered Outcome: Not Applicable Date Met:  75/10/25 TDWB RLE --PT eval pending

## 2013-11-13 NOTE — Consult Note (Signed)
Patient seen and examined.  Plan on removing the right groin nodule which came up about 1-2 weeks ago.  We discussed the procedure and risks.  Risks include but are not limited to bleeding, infection, anesthesia, wound problems.

## 2013-11-13 NOTE — Consult Note (Signed)
Reason for Consult:  Metastatic cancer with unknown primary Referring Physician: Louellen Molder, MD   Sonia Weeks is an 80 y.o. female.  HPI: this is a very pleasant woman who fell and was found to have a nondisplaced right greater trochanter fracture.  She was admitted for treatment of this last evening.   She has had large parotid glands and was seen for this December 2012, by Dr. Janace Hoard with a diagnosis of parotitis.  Nothing else in the Epic about this since 01/17/11. She has been found to have a metastatic Ca of uncertain etiology, and evaluated by Dr. Julien Nordmann.   CT scans show:  lymphadenopathy in the chest. Index left axillary node measures 1.8 x 1.0 cm. A precarinal node, measures 2.7 x 2.4 cm. Right infrahilar node measures 1.4 x 1.6 cm. AP window node measures 2.4 x 1.7 cm. A left upper lobe mass in the hilum measures 2.7 x 3.2 cm in addition to other small nodules in the right middle lobe and right lung base contiguous with the pleura measuring 1.3 x 0.8 CM. In the abdomen there was also hypoattenuating lesion in the right hepatic lobe measuring 1.7 CM and a second hypoattenuating lesion measuring 0.6 CM. There was also bilateral adrenal gland lesion and a lesion in the left kidney measuring 2.2 x 1.5 CM. There was also extensive lymphadenopathy in the abdomen. The left ovary has an abnormal appearance measuring 2.8 x 4.0 cm. The right ovary is also somewhat prominent measuring 1.9 x 3.0 cm. The colon is unremarkable.   She was scheduled to see Dr. Barry Dienes in our office  today and have a bx.  To be followed by PET scan and MRI.  She has had an MRI.  Since admission yesterday she has also had a MRI of the brain showing:  Innumerable widespread intracranial metastatic deposits as  described. Posterior fossa lesions predominate, with potential for  obstructive hydrocephalus should the RIGHT cerebellar or LEFT paramedian pontine lesions significant enlarge.  Subcentimeter foci of presumed acute  hemorrhage are associated with  a minority of lesions.  Incompletely evaluated bulky cervical adenopathy.   She has tried to be seen in the office twice and has something occur that precluded being seen.  We are ask to see her today and discuss biopsy here in the hospital.  None of this adenopathy is tender or painful.         Past Medical History  Diagnosis Date  . Hypertension   . Diabetes mellitus   . Arthritis   . Metastatic cancer 10/26/2013    Past Surgical History  Procedure Laterality Date  . Ectopic pregnancy surgery      History reviewed. No pertinent family history.  Social History:  reports that she has been smoking.  She does not have any smokeless tobacco history on file. She reports that she does not drink alcohol or use illicit drugs.  Allergies: No Known Allergies  Medications:  Prior to Admission:  Prescriptions prior to admission  Medication Sig Dispense Refill  . acetaminophen (TYLENOL) 500 MG tablet Take 1,000 mg by mouth every 6 (six) hours as needed for moderate pain.      Marland Kitchen albuterol (PROVENTIL HFA;VENTOLIN HFA) 108 (90 BASE) MCG/ACT inhaler Inhale 2 puffs into the lungs every 6 (six) hours as needed for wheezing or shortness of breath.      Marland Kitchen HYDROcodone-acetaminophen (NORCO/VICODIN) 5-325 MG per tablet Take 1-2 tablets by mouth every 6 (six) hours as needed for moderate pain.      Marland Kitchen  naproxen (NAPROSYN) 500 MG tablet Take 500 mg by mouth 2 (two) times daily as needed for moderate pain.      . verapamil (COVERA HS) 180 MG (CO) 24 hr tablet Take 180 mg by mouth at bedtime.        . hydrochlorothiazide (MICROZIDE) 12.5 MG capsule Take 25 mg by mouth daily.        Scheduled: . dexamethasone  4 mg Oral 4 times per day  . docusate sodium  100 mg Oral BID  . enoxaparin (LOVENOX) injection  40 mg Subcutaneous Q24H  . feeding supplement (ENSURE COMPLETE)  237 mL Oral BID BM  . [START ON 11/14/2013] Influenza vac split quadrivalent PF  0.5 mL Intramuscular  Tomorrow-1000  . insulin aspart  0-15 Units Subcutaneous TID WC  . insulin aspart  0-5 Units Subcutaneous QHS  . megestrol  40 mg Oral Daily  . nicotine  14 mg Transdermal Daily  . pantoprazole  40 mg Oral Q0600  . [START ON 11/14/2013] pneumococcal 23 valent vaccine  0.5 mL Intramuscular Tomorrow-1000  . verapamil  180 mg Oral QHS   Continuous: . sodium chloride 75 mL/hr at 11/13/13 1236   [facility]:XNATFTDDU, HYDROcodone-acetaminophen, ibuprofen, ipratropium-albuterol, methocarbamol (ROBAXIN) IV, methocarbamol, morphine injection, naproxen, ondansetron (ZOFRAN) IV, polyethylene glycol, sorbitol, zolpidem Anti-infectives   None      Results for orders placed during the hospital encounter of 11/12/13 (from the past 48 hour(s))  CBC WITH DIFFERENTIAL     Status: None   Collection Time    11/12/13 11:14 AM      Result Value Ref Range   WBC 9.2  4.0 - 10.5 K/uL   RBC 4.00  3.87 - 5.11 MIL/uL   Hemoglobin 12.7  12.0 - 15.0 g/dL   HCT 37.0  36.0 - 46.0 %   MCV 92.5  78.0 - 100.0 fL   MCH 31.8  26.0 - 34.0 pg   MCHC 34.3  30.0 - 36.0 g/dL   RDW 12.0  11.5 - 15.5 %   Platelets 268  150 - 400 K/uL   Neutrophils Relative % 70  43 - 77 %   Lymphocytes Relative 18  12 - 46 %   Monocytes Relative 8  3 - 12 %   Eosinophils Relative 3  0 - 5 %   Basophils Relative 1  0 - 1 %   Neutro Abs 6.4  1.7 - 7.7 K/uL   Lymphs Abs 1.7  0.7 - 4.0 K/uL   Monocytes Absolute 0.7  0.1 - 1.0 K/uL   Eosinophils Absolute 0.3  0.0 - 0.7 K/uL   Basophils Absolute 0.1  0.0 - 0.1 K/uL   Smear Review MORPHOLOGY UNREMARKABLE    COMPREHENSIVE METABOLIC PANEL     Status: Abnormal   Collection Time    11/12/13 11:14 AM      Result Value Ref Range   Sodium 138  137 - 147 mEq/L   Potassium 4.2  3.7 - 5.3 mEq/L   Chloride 98  96 - 112 mEq/L   CO2 27  19 - 32 mEq/L   Glucose, Bld 116 (*) 70 - 99 mg/dL   BUN 15  6 - 23 mg/dL   Creatinine, Ser 0.86  0.50 - 1.10 mg/dL   Calcium 9.4  8.4 - 10.5 mg/dL   Total Protein  8.4 (*) 6.0 - 8.3 g/dL   Albumin 3.1 (*) 3.5 - 5.2 g/dL   AST 213 (*) 0 - 37 U/L   ALT 136 (*)  0 - 35 U/L   Alkaline Phosphatase 294 (*) 39 - 117 U/L   Total Bilirubin 1.0  0.3 - 1.2 mg/dL   GFR calc non Af Amer 60 (*) >90 mL/min   GFR calc Af Amer 70 (*) >90 mL/min   Comment: (NOTE)     The eGFR has been calculated using the CKD EPI equation.     This calculation has not been validated in all clinical situations.     eGFR's persistently <90 mL/min signify possible Chronic Kidney     Disease.   Anion gap 13  5 - 15  TROPONIN I     Status: None   Collection Time    11/12/13 11:14 AM      Result Value Ref Range   Troponin I <0.30  <0.30 ng/mL   Comment:            Due to the release kinetics of cTnI,     a negative result within the first hours     of the onset of symptoms does not rule out     myocardial infarction with certainty.     If myocardial infarction is still suspected,     repeat the test at appropriate intervals.  URINALYSIS, ROUTINE W REFLEX MICROSCOPIC     Status: Abnormal   Collection Time    11/12/13 12:01 PM      Result Value Ref Range   Color, Urine YELLOW  YELLOW   APPearance CLOUDY (*) CLEAR   Specific Gravity, Urine 1.011  1.005 - 1.030   pH 7.0  5.0 - 8.0   Glucose, UA NEGATIVE  NEGATIVE mg/dL   Hgb urine dipstick TRACE (*) NEGATIVE   Bilirubin Urine NEGATIVE  NEGATIVE   Ketones, ur NEGATIVE  NEGATIVE mg/dL   Protein, ur 30 (*) NEGATIVE mg/dL   Urobilinogen, UA 0.2  0.0 - 1.0 mg/dL   Nitrite NEGATIVE  NEGATIVE   Leukocytes, UA NEGATIVE  NEGATIVE  URINE MICROSCOPIC-ADD ON     Status: None   Collection Time    11/12/13 12:01 PM      Result Value Ref Range   Squamous Epithelial / LPF RARE  RARE   WBC, UA 0-2  <3 WBC/hpf   RBC / HPF 0-2  <3 RBC/hpf  ABO/RH     Status: None   Collection Time    11/12/13  6:00 PM      Result Value Ref Range   ABO/RH(D) A POS    HEMOGLOBIN A1C     Status: Abnormal   Collection Time    11/12/13  6:10 PM      Result  Value Ref Range   Hemoglobin A1C 6.9 (*) <5.7 %   Comment: (NOTE)                                                                               According to the ADA Clinical Practice Recommendations for 2011, when     HbA1c is used as a screening test:      >=6.5%   Diagnostic of Diabetes Mellitus               (if abnormal result is confirmed)  5.7-6.4%   Increased risk of developing Diabetes Mellitus     References:Diagnosis and Classification of Diabetes Mellitus,Diabetes     STMH,9622,29(NLGXQ 1):S62-S69 and Standards of Medical Care in             Diabetes - 2011,Diabetes JJHE,1740,81 (Suppl 1):S11-S61.   Mean Plasma Glucose 151 (*) <117 mg/dL   Comment: Performed at [facility]  CBC     Status: Abnormal   Collection Time    11/12/13  6:10 PM      Result Value Ref Range   WBC 8.1  4.0 - 10.5 K/uL   RBC 3.59 (*) 3.87 - 5.11 MIL/uL   Hemoglobin 11.3 (*) 12.0 - 15.0 g/dL   HCT 33.7 (*) 36.0 - 46.0 %   MCV 93.9  78.0 - 100.0 fL   MCH 31.5  26.0 - 34.0 pg   MCHC 33.5  30.0 - 36.0 g/dL   RDW 12.3  11.5 - 15.5 %   Platelets 209  150 - 400 K/uL  CREATININE, SERUM     Status: Abnormal   Collection Time    11/12/13  6:10 PM      Result Value Ref Range   Creatinine, Ser 0.84  0.50 - 1.10 mg/dL   GFR calc non Af Amer 62 (*) >90 mL/min   GFR calc Af Amer 72 (*) >90 mL/min   Comment: (NOTE)     The eGFR has been calculated using the CKD EPI equation.     This calculation has not been validated in all clinical situations.     eGFR's persistently <90 mL/min signify possible Chronic Kidney     Disease.  PROTIME-INR     Status: None   Collection Time    11/12/13  6:10 PM      Result Value Ref Range   Prothrombin Time 14.3  11.6 - 15.2 seconds   INR 1.10  0.00 - 1.49  TYPE AND SCREEN     Status: None   Collection Time    11/12/13  6:43 PM      Result Value Ref Range   ABO/RH(D) A POS     Antibody Screen NEG     Sample Expiration 11/15/2013    GLUCOSE, CAPILLARY      Status: Abnormal   Collection Time    11/12/13  9:44 PM      Result Value Ref Range   Glucose-Capillary 105 (*) 70 - 99 mg/dL   Comment 1 Notify RN    HEPATIC FUNCTION PANEL     Status: Abnormal   Collection Time    11/13/13  3:45 AM      Result Value Ref Range   Total Protein 7.0  6.0 - 8.3 g/dL   Albumin 2.4 (*) 3.5 - 5.2 g/dL   AST 185 (*) 0 - 37 U/L   ALT 156 (*) 0 - 35 U/L   Alkaline Phosphatase 301 (*) 39 - 117 U/L   Total Bilirubin 1.8 (*) 0.3 - 1.2 mg/dL   Bilirubin, Direct 1.1 (*) 0.0 - 0.3 mg/dL   Indirect Bilirubin 0.7  0.3 - 0.9 mg/dL  CBC     Status: Abnormal   Collection Time    11/13/13  3:45 AM      Result Value Ref Range   WBC 7.7  4.0 - 10.5 K/uL   RBC 3.55 (*) 3.87 - 5.11 MIL/uL   Hemoglobin 11.1 (*) 12.0 - 15.0 g/dL   HCT 32.9 (*) 36.0 - 46.0 %  MCV 92.7  78.0 - 100.0 fL   MCH 31.3  26.0 - 34.0 pg   MCHC 33.7  30.0 - 36.0 g/dL   RDW 12.2  11.5 - 15.5 %   Platelets 243  150 - 400 K/uL  BASIC METABOLIC PANEL     Status: Abnormal   Collection Time    11/13/13  3:45 AM      Result Value Ref Range   Sodium 135 (*) 137 - 147 mEq/L   Potassium 3.9  3.7 - 5.3 mEq/L   Chloride 98  96 - 112 mEq/L   CO2 24  19 - 32 mEq/L   Glucose, Bld 101 (*) 70 - 99 mg/dL   BUN 12  6 - 23 mg/dL   Creatinine, Ser 0.78  0.50 - 1.10 mg/dL   Calcium 8.7  8.4 - 10.5 mg/dL   GFR calc non Af Amer 75 (*) >90 mL/min   GFR calc Af Amer 87 (*) >90 mL/min   Comment: (NOTE)     The eGFR has been calculated using the CKD EPI equation.     This calculation has not been validated in all clinical situations.     eGFR's persistently <90 mL/min signify possible Chronic Kidney     Disease.   Anion gap 13  5 - 15  GLUCOSE, CAPILLARY     Status: None   Collection Time    11/13/13  7:56 AM      Result Value Ref Range   Glucose-Capillary 94  70 - 99 mg/dL  GLUCOSE, CAPILLARY     Status: Abnormal   Collection Time    11/13/13 12:05 PM      Result Value Ref Range   Glucose-Capillary 136  (*) 70 - 99 mg/dL    Dg Hip Complete Right  11/12/2013   CLINICAL DATA:  Patient fell in her home this morning injuring right hip. Right hip pain.  EXAM: RIGHT HIP - COMPLETE 2+ VIEW  COMPARISON:  None.  FINDINGS: No fracture. Hip joint is normally aligned. Bones are diffusely demineralized.  Pelvis is intact. SI joints and symphysis pubis are normally aligned.  Bones are diffusely demineralized.  Soft tissues are unremarkable.  IMPRESSION: No fracture or dislocation.   Electronically Signed   By: Lajean Manes M.D.   On: 11/12/2013 11:39   Ct Head Wo Contrast  11/12/2013   CLINICAL DATA:  Status post fall is more known with injury/laceration.  EXAM: CT HEAD WITHOUT CONTRAST  TECHNIQUE: Contiguous axial images were obtained from the base of the skull through the vertex without intravenous contrast.  COMPARISON:  CT neck 01/13/2011  FINDINGS: Ventricles are normal in size. Negative for intra or extra-axial hemorrhage, mass effect, mass lesion, or evidence of acute cortically based infarction. Mild chronic microvascular ischemic changes in the periventricular white matter bilaterally. Dural calcifications noted near the vertex.  Extensive sinus disease noted with frothy secretions and air-fluid levels in both maxillary sinuses and scattered fluid in the ethmoid air cells. Visualized mastoid air cells are clear. Inferior aspect of the right mastoid air cells not included on the images.  Skull is somewhat thickened. Negative for skull fracture. Mandibular condyles located.  Soft tissues of the scalp are symmetric and there is no evidence of scalp hematoma.  IMPRESSION: Acute appearing bilateral axillary and ethmoid sinusitis.  Negative for scalp hematoma or skull fracture.  No acute intracranial abnormality.   Electronically Signed   By: Curlene Dolphin M.D.   On: 11/12/2013 11:37  Mr Jeri Cos Wo Contrast  11/13/2013   CLINICAL DATA:  Staging of widely metastatic disease of unknown primary, pending tissue  diagnosis.  EXAM: MRI HEAD WITHOUT AND WITH CONTRAST  TECHNIQUE: Multiplanar, multiecho pulse sequences of the brain and surrounding structures were obtained without and with intravenous contrast.  CONTRAST:  45m MULTIHANCE GADOBENATE DIMEGLUMINE 529 MG/ML IV SOLN  COMPARISON:  CT head 11/12/2013.  FINDINGS: There are innumerable widespread intracranial metastatic deposits which are concentrated in the posterior fossa. The largest lesion involves the RIGHT cerebellar hemisphere, and is surrounded by mild vasogenic edema with slight mass effect on the fourth ventricle. It measures 21 x 22 x 20 mm (R-L x A-P x C-C). The largest brainstem lesion is seen in the LEFT paramedian pons measuring 15 x 13 x 13 mm. Extensive widespread supratentorial metastatic lesions are largely subcentimeter in size. The majority lesions show contrast enhancement with central necrosis although some are solid.  Subcentimeter foci of presumed acute hemorrhage (or melanin) is associated with only a small proportion of lesions, including the LEFT paramedian pontine lesion, the LEFT cerebellar peripheral hemispheric lesion, a LEFT frontal subependymal lesion near the caudate, as well as a LEFT posterior frontal cortical lesion.  Generalized atrophy. Chronic microvascular ischemic change in addition to T2 and FLAIR hyperintensity surrounding the multiple metastatic deposits. No tonsillar herniation. No pituitary lesion. No impending hydrocephalus. No midline shift. Flow voids are maintained. Negative orbits. Acute BILATERAL maxillary sinus and ethmoid disease. Slight heterogeneity of marrow signal without focal osseous lesion. No definite meningeal enhancement.  There is bulky cervical lymphadenopathy incompletely evaluated. As seen on coronal imaging, presumed lymphadenopathy is intimately involved with both the LEFT and RIGHT parotid glands. BILATERAL parotid masses are not excluded, but given the findings on chest and abdomen CT, cervical  lymphadenopathy is favored.  IMPRESSION: Innumerable widespread intracranial metastatic deposits as described. Posterior fossa lesions predominate, with potential for obstructive hydrocephalus should the RIGHT cerebellar or LEFT paramedian pontine lesions significant enlarge.  Subcentimeter foci of presumed acute hemorrhage are associated with a minority of lesions.  Incompletely evaluated bulky cervical adenopathy.   Electronically Signed   By: JRolla FlattenM.D.   On: 11/13/2013 08:20   Ct Hip Right Wo Contrast  11/12/2013   CLINICAL DATA:  Fall, right hip pain  EXAM: CT OF THE RIGHT HIP WITHOUT CONTRAST  TECHNIQUE: Multidetector CT imaging of the right hip was performed according to the standard protocol. Multiplanar CT image reconstructions were also generated.  COMPARISON:  Right hip radiographs dated 11/12/2013  FINDINGS: Nondisplaced fracture involving the right greater trochanter (series 4/image 52).  Right femoral neck is intact.  Mild degenerative changes of the right hip.  Visualized pelvic soft tissues are notable for vascular calcifications but are otherwise within normal limits.  IMPRESSION: Nondisplaced fracture involving the right greater trochanter.   Electronically Signed   By: SJulian HyM.D.   On: 11/12/2013 15:30   Chest Portable 1 View  11/12/2013   CLINICAL DATA:  Smoker, preoperative evaluation for hip fracture. Personal history hypertension, diabetes, smoking, metastatic cancer  EXAM: PORTABLE CHEST - 1 VIEW  COMPARISON:  Portable exam 1759 hr compared to 10/16/2013  FINDINGS: Normal heart size, mediastinal contours and pulmonary vascularity.  Atherosclerotic calcification aortic arch.  LEFT perihilar mass again identified, poorly defined, appears slightly more prominent versus prior study though this may be related to AP versus PA technique.  Few poorly defined pulmonary nodules are questioned in the lower RIGHT lung.  Underlying emphysematous  changes.  No pleural effusion or  pneumothorax.  Bones demineralized.  IMPRESSION: Emphysematous changes with persistent RIGHT perihilar mass in question subtle nodular densities in the lower RIGHT lung.   Electronically Signed   By: Lavonia Dana M.D.   On: 11/12/2013 18:29    Review of Systems  Constitutional: Positive for weight loss.  HENT: Negative.   Eyes: Positive for blurred vision.       She has been legally blind for some years.  Respiratory: Negative.   Cardiovascular: Negative.   Gastrointestinal: Negative for nausea, vomiting, diarrhea, constipation and blood in stool.       She says she had something on her abdomen.  Genitourinary: Negative.   Musculoskeletal: Positive for falls.  Skin: Negative.   Neurological:       Mentally she is fine and very alert.    Endo/Heme/Allergies: Bruises/bleeds easily.  Psychiatric/Behavioral: Negative.    Blood pressure 163/67, pulse 80, temperature 98.2 F (36.8 C), temperature source Oral, resp. rate 16, height 5' 2"  (1.575 m), weight 57.1 kg (125 lb 14.1 oz), SpO2 95.00%. Physical Exam  Constitutional: She is oriented to person, place, and time. She appears well-developed and well-nourished. No distress.  Elderly female, in no distress.  HENT:  Head: Normocephalic and atraumatic.  Nose: Nose normal.  Eyes: Right eye exhibits no discharge. Left eye exhibits no discharge. No scleral icterus.  Neck: Normal range of motion. Neck supple. No JVD present. No tracheal deviation present. No thyromegaly present.  She has large post auricular nodes/parotids  on both sides. 8 x 4.5 cm on the right. 10 x 7.5 on the left. Bilateral supraclavicular lymph nodes about 1 cm in diameter.  Bilateral axillary nodes. Nodes and and parotid are not tender, all are hard.   Cardiovascular: Normal rate, regular rhythm, normal heart sounds and intact distal pulses.  Exam reveals no gallop.   No murmur heard. Respiratory: Effort normal and breath sounds normal. No respiratory distress. She has  no wheezes. She has no rales. She exhibits no tenderness.  1 cm sub xyphoid lymphnode. None of the nodes are painful, all hard and rubbery.  GI: Soft. She exhibits no distension and no mass. There is no tenderness. There is no rebound and no guarding.  She has a midline lower abdominal hernia from prior ectopic pregnancy and abdominal lymph nodes also. non tender nodes.  Musculoskeletal: She exhibits no edema and no tenderness.  Lymphadenopathy:    She has no cervical adenopathy.  Neurological: She is alert and oriented to person, place, and time. No cranial nerve deficit.  She is legally blind with ongoing decreased vision.  Skin: Skin is warm and dry. No rash noted. She is not diaphoretic. No erythema. No pallor.  Psychiatric: She has a normal mood and affect. Her behavior is normal. Judgment and thought content normal.    Assessment/Plan: 1.  Metastatic cancer of uncertain etiology, including; left and right lung, bilateral adrenal lesions, right hepatic lobe, left kidney, abdomen and now brain metastasis. 2.  Hx of Parotitis 3.  Hypertension 5.  AODM 6.  Legally blind 7.  Hx of tobacco use 8.  Arthritis    Plan:  We are ask to see and obtain a lymph node biopsy.  The easiest is just below her sternum, and groin.  I will make her NPO after MN and see if we cannot get in done tomorrow.  Dr. Zella Richer will see after surgery.  She may need radiation with her brain Metastasis.  Sonia Weeks 11/13/2013, 3:29 PM

## 2013-11-13 NOTE — Evaluation (Signed)
SLP Cancellation Note  Patient Details Name: Sonia Weeks MRN: [id number] DOB: 03-24-1927   Cancelled treatment:       Reason Eval/Treat Not Completed:  (pt with case manager and social worker currently, spoke to RN who reports night nurse reports pt tolerated po medications given one at a time.  Submental edema noted on *left side.  SLP to return next date for evaluation.  )   Luanna Salk, [place] SLP [phone]

## 2013-11-14 ENCOUNTER — Ambulatory Visit (HOSPITAL_COMMUNITY): Admission: RE | Admit: 2013-11-14 | Payer: Medicare Other | Source: Ambulatory Visit

## 2013-11-14 ENCOUNTER — Encounter (HOSPITAL_COMMUNITY): Payer: Self-pay | Admitting: Certified Registered"

## 2013-11-14 ENCOUNTER — Inpatient Hospital Stay (HOSPITAL_COMMUNITY): Payer: Medicare Other | Admitting: Certified Registered"

## 2013-11-14 ENCOUNTER — Encounter (HOSPITAL_COMMUNITY)
Admission: EM | Disposition: A | Discharge status: Skilled Nursing Facility | Payer: Self-pay | Source: Home / Self Care | Attending: Internal Medicine

## 2013-11-14 ENCOUNTER — Encounter (HOSPITAL_COMMUNITY): Payer: Medicare Other | Admitting: Certified Registered"

## 2013-11-14 DIAGNOSIS — I10 Essential (primary) hypertension: Secondary | ICD-10-CM

## 2013-11-14 HISTORY — PX: GROIN DISSECTION: SHX5250

## 2013-11-14 LAB — GLUCOSE, CAPILLARY
GLUCOSE-CAPILLARY: 138 mg/dL — AB (ref 70–99)
GLUCOSE-CAPILLARY: 172 mg/dL — AB (ref 70–99)
Glucose-Capillary: 156 mg/dL — ABNORMAL HIGH (ref 70–99)
Glucose-Capillary: 154 mg/dL — ABNORMAL HIGH (ref 70–99)

## 2013-11-14 LAB — URINE CULTURE
COLONY COUNT: NO GROWTH
Culture: NO GROWTH

## 2013-11-14 LAB — SURGICAL PCR SCREEN
MRSA, PCR: NEGATIVE
Staphylococcus aureus: POSITIVE — AB

## 2013-11-14 SURGERY — EXPLORATION, INGUINAL REGION
Anesthesia: Monitor Anesthesia Care | Laterality: Right

## 2013-11-14 MED ORDER — LIDOCAINE HCL 1 % IJ SOLN
INTRAMUSCULAR | Status: DC | PRN
  Administered 2013-11-14: 8 mL

## 2013-11-14 MED ORDER — LIDOCAINE HCL (CARDIAC) 20 MG/ML IV SOLN
INTRAVENOUS | Status: AC
  Filled 2013-11-14: qty 5

## 2013-11-14 MED ORDER — FENTANYL CITRATE 0.05 MG/ML IJ SOLN
INTRAMUSCULAR | Status: DC | PRN
  Administered 2013-11-14: 25 ug via INTRAVENOUS
  Administered 2013-11-14: 50 ug via INTRAVENOUS
  Administered 2013-11-14: 25 ug via INTRAVENOUS

## 2013-11-14 MED ORDER — PROPOFOL INFUSION 10 MG/ML OPTIME
INTRAVENOUS | Status: DC | PRN
  Administered 2013-11-14: 50 ug/kg/min via INTRAVENOUS

## 2013-11-14 MED ORDER — CHLORHEXIDINE GLUCONATE CLOTH 2 % EX PADS
6.0000 | MEDICATED_PAD | Freq: Every day | CUTANEOUS | Status: DC
  Administered 2013-11-14 – 2013-11-17 (×4): 6 via TOPICAL

## 2013-11-14 MED ORDER — MUPIROCIN 2 % EX OINT
1.0000 "application " | TOPICAL_OINTMENT | Freq: Two times a day (BID) | CUTANEOUS | Status: DC
  Administered 2013-11-14 – 2013-11-17 (×7): 1 via NASAL
  Filled 2013-11-14: qty 22

## 2013-11-14 MED ORDER — PROPOFOL 10 MG/ML IV BOLUS
INTRAVENOUS | Status: AC
  Filled 2013-11-14: qty 20

## 2013-11-14 MED ORDER — LIDOCAINE HCL 1 % IJ SOLN
INTRAMUSCULAR | Status: AC
  Filled 2013-11-14: qty 20

## 2013-11-14 MED ORDER — FENTANYL CITRATE 0.05 MG/ML IJ SOLN
INTRAMUSCULAR | Status: AC
  Filled 2013-11-14: qty 2

## 2013-11-14 MED ORDER — ONDANSETRON HCL 4 MG/2ML IJ SOLN
INTRAMUSCULAR | Status: AC
  Filled 2013-11-14: qty 2

## 2013-11-14 MED ORDER — BUPIVACAINE HCL (PF) 0.5 % IJ SOLN
INTRAMUSCULAR | Status: AC
  Filled 2013-11-14: qty 30

## 2013-11-14 MED ORDER — FENTANYL CITRATE 0.05 MG/ML IJ SOLN: 25.0000 ug | INTRAMUSCULAR | Status: DC | PRN

## 2013-11-14 MED ORDER — LACTATED RINGERS IV SOLN
INTRAVENOUS | Status: DC
  Administered 2013-11-14: 1000 mL via INTRAVENOUS

## 2013-11-14 MED ORDER — LIDOCAINE HCL (CARDIAC) 20 MG/ML IV SOLN
INTRAVENOUS | Status: DC | PRN
  Administered 2013-11-14: 50 mg via INTRAVENOUS

## 2013-11-14 MED ORDER — ONDANSETRON HCL 4 MG/2ML IJ SOLN
INTRAMUSCULAR | Status: DC | PRN
  Administered 2013-11-14: 4 mg via INTRAVENOUS

## 2013-11-14 SURGICAL SUPPLY — 40 items
BENZOIN TINCTURE PRP APPL 2/3 (GAUZE/BANDAGES/DRESSINGS) ×3 IMPLANT
BLADE HEX COATED 2.75 (ELECTRODE) ×3 IMPLANT
BLADE SURG 15 STRL LF DISP TIS (BLADE) ×1 IMPLANT
BLADE SURG 15 STRL SS (BLADE) ×2
CANISTER SUCTION 2500CC (MISCELLANEOUS) ×3 IMPLANT
CATH KIT ON Q 2.5IN SLV (PAIN MANAGEMENT) IMPLANT
CLOSURE WOUND 1/2 X4 (GAUZE/BANDAGES/DRESSINGS) ×1
DECANTER SPIKE VIAL GLASS SM (MISCELLANEOUS) ×3 IMPLANT
DRAIN PENROSE 18X1/2 LTX STRL (DRAIN) ×3 IMPLANT
DRAPE INCISE IOBAN 66X45 STRL (DRAPES) IMPLANT
DRAPE LAPAROTOMY TRNSV 102X78 (DRAPE) ×3 IMPLANT
DRSG TEGADERM 4X4.75 (GAUZE/BANDAGES/DRESSINGS) ×3 IMPLANT
ELECT REM PT RETURN 9FT ADLT (ELECTROSURGICAL) ×3
ELECTRODE REM PT RTRN 9FT ADLT (ELECTROSURGICAL) ×1 IMPLANT
GAUZE SPONGE 2X2 8PLY STRL LF (GAUZE/BANDAGES/DRESSINGS) ×1 IMPLANT
GAUZE SPONGE 4X4 12PLY STRL (GAUZE/BANDAGES/DRESSINGS) IMPLANT
GLOVE ECLIPSE 8.0 STRL XLNG CF (GLOVE) ×3 IMPLANT
GLOVE INDICATOR 8.0 STRL GRN (GLOVE) ×3 IMPLANT
GOWN STRL REUS W/TWL XL LVL3 (GOWN DISPOSABLE) ×6 IMPLANT
KIT BASIN OR (CUSTOM PROCEDURE TRAY) ×3 IMPLANT
NEEDLE HYPO 25X1 1.5 SAFETY (NEEDLE) ×3 IMPLANT
NS IRRIG 1000ML POUR BTL (IV SOLUTION) ×3 IMPLANT
PACK BASIC VI WITH GOWN DISP (CUSTOM PROCEDURE TRAY) ×3 IMPLANT
PENCIL BUTTON HOLSTER BLD 10FT (ELECTRODE) ×3 IMPLANT
PUMP ON Q 100MLX2ML/HR (PAIN MANAGEMENT) IMPLANT
SPONGE GAUZE 2X2 STER 10/PKG (GAUZE/BANDAGES/DRESSINGS) ×2
SPONGE LAP 4X18 X RAY DECT (DISPOSABLE) ×3 IMPLANT
STRIP CLOSURE SKIN 1/2X4 (GAUZE/BANDAGES/DRESSINGS) ×2 IMPLANT
SUT MNCRL AB 4-0 PS2 18 (SUTURE) ×3 IMPLANT
SUT PROLENE 2 0 CT2 30 (SUTURE) ×6 IMPLANT
SUT VIC AB 2-0 SH 18 (SUTURE) ×3 IMPLANT
SUT VIC AB 3-0 54XBRD REEL (SUTURE) ×1 IMPLANT
SUT VIC AB 3-0 BRD 54 (SUTURE) ×2
SUT VIC AB 3-0 SH 27 (SUTURE) ×2
SUT VIC AB 3-0 SH 27XBRD (SUTURE) ×1 IMPLANT
SYR 20CC LL (SYRINGE) ×3 IMPLANT
SYR BULB IRRIGATION 50ML (SYRINGE) ×3 IMPLANT
TOWEL OR 17X26 10 PK STRL BLUE (TOWEL DISPOSABLE) ×3 IMPLANT
TOWEL OR NON WOVEN STRL DISP B (DISPOSABLE) ×3 IMPLANT
YANKAUER SUCT BULB TIP 10FT TU (MISCELLANEOUS) ×3 IMPLANT

## 2013-11-14 NOTE — Care Management Note (Signed)
CARE MANAGEMENT NOTE 11/14/2013  Patient:  Sonia Weeks, Sonia Weeks   Account Number:  [id number]  Date Initiated:  11/13/2013  Documentation initiated by:  Marney Doctor  Subjective/Objective Assessment:   80 yo admitted with fracture of greater trochanter of right femur. New diagnosis of Metastatic CA     Action/Plan:   From home alone   Anticipated DC Date:  11/17/2013   Anticipated DC Plan:  SKILLED NURSING FACILITY  In-house referral  Clinical Social Worker      DC Planning Services  CM consult      Choice offered to / List presented to:             Status of service:  In process, will continue to follow Medicare Important Message given?   (If response is "NO", the following Medicare IM given date fields will be blank) Date Medicare IM given:   Medicare IM given by:   Date Additional Medicare IM given:   Additional Medicare IM given by:    Discharge Disposition:    Per UR Regulation:  Reviewed for med. necessity/level of care/duration of stay  If discussed at [place] of Stay Meetings, dates discussed:    Comments:  11/14/13 Marney Doctor RN,BSN,NCM Checked in with pt and daughter about DC plan.  Family has now decided to send pt to SNF.  CSW alerted of plan.  11/13/13 Marney Doctor RN,BSN,NCM Met with pt and family and CSW to discuss DC needs.  Pt open to SNF option but family would really like to take her home.  Family to discuss options and this CM will check back.  [place] agency list and private care list provided for their information.  Will continue to follow.

## 2013-11-14 NOTE — Progress Notes (Signed)
CSW continuing to follow.  CSW received notification from [facility] that pt and pt daughter have decided on SNF for discharge plan and agreeable to initiation of SNF search.  CSW completed FL2 and initiated SNF search to [facility].  CSW to follow up with pt and pt daughter with SNF bed offers.  CSW to continue to follow to provide support and assist with pt disposition needs.  Alison Murray, MSW, [place] [facility] [phone]

## 2013-11-14 NOTE — Anesthesia Preprocedure Evaluation (Signed)
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    Pulmonary Current Smoker,  breath sounds clear to auscultation  Pulmonary exam normal       Cardiovascular hypertension, Pt. on medications Rhythm:Regular Rate:Normal     Neuro/Psych negative neurological ROS  negative psych ROS   GI/Hepatic negative GI ROS, Neg liver ROS,   Endo/Other  diabetes, Type 2  Renal/GU negative Renal ROS  negative genitourinary   Musculoskeletal  (+) Arthritis -,   Abdominal   Peds negative pediatric ROS (+)  Hematology negative hematology ROS (+)   Anesthesia Other Findings   Reproductive/Obstetrics negative OB ROS                             Anesthesia Physical Anesthesia Plan  ASA: III  Anesthesia Plan: MAC   Post-op Pain Management:    Induction: Intravenous  Airway Management Planned:   Additional Equipment:   Intra-op Plan:   Post-operative Plan:   Informed Consent: I have reviewed the patients History and Physical, chart, labs and discussed the procedure including the risks, benefits and alternatives for the proposed anesthesia with the patient or authorized representative who has indicated his/her understanding and acceptance.   Dental advisory given  Plan Discussed with: CRNA  Anesthesia Plan Comments:         Anesthesia Quick Evaluation

## 2013-11-14 NOTE — Progress Notes (Signed)
Patient confused overnight/this AM, unable to sign own consent. Consent obtained via telephone from daughter Thurnell Garbe), witnessed by myself and Mathis Fare.

## 2013-11-14 NOTE — Progress Notes (Signed)
Pt currently in the OR.  Will check back as schedule allows. Jinger Neighbors, [place] [phone]

## 2013-11-14 NOTE — Transfer of Care (Signed)
Immediate Anesthesia Transfer of Care Note  Patient: Sonia Weeks  Procedure(s) Performed: Procedure(s): REMOVAL OF RIGHT GROIN NODULE (Right)  Patient Location: PACU  Anesthesia Type:MAC  Level of Consciousness: awake, alert  and oriented  Airway & Oxygen Therapy: Patient Spontanous Breathing and Patient connected to face mask oxygen  Post-op Assessment: Report given to PACU RN and Post -op Vital signs reviewed and stable  Post vital signs: Reviewed and stable  Complications: No apparent anesthesia complications

## 2013-11-14 NOTE — Progress Notes (Signed)
Clinical Social Work Department BRIEF PSYCHOSOCIAL ASSESSMENT 11/14/2013  Patient:  Sonia Weeks, Sonia Weeks     Account Number:  [id number]     Admit date:  11/12/2013  Clinical Social Worker:  Ulyess Blossom  Date/Time:  11/14/2013 02:45 PM  Referred by:  Physician  Date Referred:  11/13/2013 Referred for  SNF Placement   Other Referral:   Interview type:  Patient Other interview type:   and patient daughter at bedside    PSYCHOSOCIAL DATA Living Status:  ALONE Admitted from facility:   Level of care:   Primary support name:  Sonia Weeks/daughter/[phone] Primary support relationship to patient:  CHILD, ADULT Degree of support available:   strong    CURRENT CONCERNS Current Concerns  Post-Acute Placement   Other Concerns:    SOCIAL WORK ASSESSMENT / PLAN CSW received referral for New SNF.    CSW met with pt and pt daughter, Sonia Cookey at bedside. RNCM present at this time as well. Pt discussed that she lives alone, but pt daughter provides assistance. CSW discussed recommendation for short term rehab at [facility]. Pt and pt daugther very hesitant about SNF and want to explore if they can arrange care at home. Family to discuss options and agreeable to CSW and RNCM returning to check. CSW discussed with pt family that [facility] can explore SNF options when pt family agreeable in order for pt and pt family to be able to consider all options. RNCM provided  [facility] agency list and private care list provided for their information. Pt and pt daughter not agreeable at this time to initiate SNF search, but will discuss option and asked for [facility] and CSW to follow up .    CSW to continue to follow to provide support and assist with pt disposition needs.   Assessment/plan status:  Psychosocial Support/Ongoing Assessment of Needs Other assessment/ plan:   discharge planning   Information/referral to community resources:   SNF referral pending at this time as pt and pt family would really like to take  pt home    PATIENT'S/FAMILY'S RESPONSE TO PLAN OF CARE: Pt alert and oriented x 4. Pt expressed her wishes to hopefully return home. CSW discussed safety concerns and encouraged pt and pt daughter to consider short term rehab at SNF in order for pt to get stronger before returning home. Pt and pt daughter agreeable to considering options and will discuss this evening.   Sonia Weeks, MSW, [place] Work [phone]

## 2013-11-14 NOTE — Evaluation (Signed)
SLP Cancellation Note  Patient Details Name: Sonia Weeks MRN: [id number] DOB: March 27, 1927   Cancelled treatment:       Reason Eval/Treat Not Completed:  (pt in OR, will check back as schedule allows)  Luanna Salk, [place] [facility] SLP [phone]

## 2013-11-14 NOTE — Op Note (Signed)
Operative Note  Sonia Weeks female 80 y.o. 11/14/2013  PREOPERATIVE DX:   Metastatic cancer of unknown etiology  POSTOPERATIVE DX:  Same  PROCEDURE:   Excision of 1.5 cm right groin subcutaneous nodule         Surgeon: Odis Hollingshead   Assistants: none  Anesthesia: Monitored Local Anesthesia with Sedation  Indications:   This is an 80 year old female who fell and was found to have a nondisplaced right greater trochanter fracture.  Upon admission, a chest x-ray showed a masslike density in the mediastinal area. CT scan of the abdomen and pelvis demonstrated lymphadenopathy, multiple liver lesions and adrenal glands, left kidney lesions, and subsequent cutaneous nodules. Brain imaging demonstrates multiple lesions. This is all consistent with metastatic disease. The primary is unknown. We've been asked to perform a biopsy in an attempt to get a diagnosis and she presents for that.    Procedure Detail:  She was seen in the holding area and the right groin marked with my initials. She is brought to the operative and placed supine on the operating table and given intravenous sedation. The right groin was sterilely prepped and draped. One percent Xylocaine was infiltrated all around the subcutaneous nodule and over the skin for local anesthetic effect. Elliptical incision is made to the skin and subcutaneous tissue. The mass and the skin with it were excised sharply. This was sent fresh to pathology. Bleeding was controlled electrocautery.  Once hemostasis was adequate the subcutaneous tissue was approximated with interrupted 3-0 Vicryl sutures. The skin is closed with running 3-0 Monocryl subcuticular stitch.  Steri-Strips and a sterile dressing were applied.  She tolerated the procedure well without any apparent complications and was taken to the recovery room in satisfactory condition.  Estimated Blood Loss:  less than 50 mL                Specimens: right groin subcutaneous  nodule        Complications:  * No complications entered in OR log *         Disposition: PACU - hemodynamically stable.         Condition: stable

## 2013-11-14 NOTE — Anesthesia Postprocedure Evaluation (Signed)
  Anesthesia Post-op Note  Patient: Sonia Weeks  Procedure(s) Performed: Procedure(s) (LRB): REMOVAL OF RIGHT GROIN NODULE (Right)  Patient Location: PACU  Anesthesia Type: MAC  Level of Consciousness: awake and alert   Airway and Oxygen Therapy: Patient Spontanous Breathing  Post-op Pain: mild  Post-op Assessment: Post-op Vital signs reviewed, Patient's Cardiovascular Status Stable, Respiratory Function Stable, Patent Airway and No signs of Nausea or vomiting  Last Vitals:  Filed Vitals:   11/14/13 1300  BP: 175/88  Pulse: 90  Temp:   Resp: 12    Post-op Vital Signs: stable   Complications: No apparent anesthesia complications

## 2013-11-14 NOTE — Evaluation (Signed)
Clinical/Bedside Swallow Evaluation Patient Details  Name: ISABELA NARDELLI MRN: [id number] Date of Birth: 02-18-1927  Today's Date: 11/14/2013 Time: 1430-1510 SLP Time Calculation (min): 40 min  Past Medical History:  Past Medical History  Diagnosis Date  . Hypertension   . Diabetes mellitus   . Arthritis   . Metastatic cancer 10/26/2013   Past Surgical History:  Past Surgical History  Procedure Laterality Date  . Ectopic pregnancy surgery     HPI:  Ms. Colantonio is a 80 y.o. female who initially presented complaining of increasing fatigue and weakness- s/p fall with HPI: this is a very pleasant woman who fell and was found to have a nondisplaced right greater trochanter fracture.  Chest x-ray on 10/16/2013 showed an abnormal masslike density in the left perihilar region.  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 Janace Hoard with a diagnosis of parotitis and no further information in Epic about this since 01/17/11 per surgery service note. Pt has been found to have a metastatic Ca of uncertain etiology per MD note, imaging studies.  MRI showed "Innumerable widespread intracranial metastatic deposits as described - Posterior fossa lesions predominate, with potential for obstructive hydrocephalus should the RIGHT cerebellar or LEFT paramedian pontine lesions significant enlarge. Subcentimeter foci of presumed acute hemorrhage are associated with a minority of lesions. Incompletely evaluated bulky cervical adenopathy."  Swallow evaluation ordered.     Assessment / Plan / Recommendation Clinical Impression  Pt presents with suspected acute exacerbation (x 3 weeks) of chronic dysphagia without symptoms of overt aspiration.  CN exam not completed in detail as pt was eating her meal when SLP arrived- and daughter was pleased pt was "finally eating".  SLP questions vagal nerve involvement due to pt's vocal hoarseness x3 weeks.   Throat  clearing noted at baseline and during po intake- pt stated at times she has to clear her throat to speak.  Oral pocketing of food noted on left due to sensory impairment which pt admits to occuring prior to admission.  Pt will swish and expectorate with water after meals to clear prn per her statement.  Pt able to self feed meal with slow but effective "mastication" (she does not wear dentures for po intake per her statement).    SLP questions if neck edema - (cervical adenopathy per imaging study) could impede into pharyngeal area and contribute to pharyngeal dysphagia.  Pt inquired as to source of her dysphagia (sensation of food lodged in pharynx - (pointing to vallecular region on right) - suspect mechanical and neurogenic components.    Educated pt to strategies she may find helpful to clear sensation of pharyngeal residuals.  Spoke to pt/daughter regarding findings of evaluation, compensation strategies to mitigate aspiration/dysphagia symptoms and wrote order for pt to have extra gravies/sauces with meal.  Pt does report xerostomia.    Reviewed option of considering MBS to allow instrumental evaluation if pt/family/MD desire - however pt appears to be tolerating intake well currrently.      Aspiration Risk  Moderate    Diet Recommendation Regular;Thin liquid (extra sauce/gravy - pt chooses soft foods to eat per her statement)   Liquid Administration via: Cup;Straw Medication Administration: Whole meds with liquid (1 at a time) Supervision: Intermittent supervision to cue for compensatory strategies;Patient able to self feed Compensations: Slow rate;Small sips/bites;Check for pocketing Postural Changes and/or Swallow Maneuvers: Seated upright 90 degrees;Upright 30-60 min after meal    Other  Recommendations Recommended Consults: MBS (if pt/daughter/MD desire) Oral Care Recommendations: Oral care BID   Follow Up Recommendations   (TBD)    Frequency and Duration min 2x/week  2 weeks    Pertinent Vitals/Pain Afebrile, decreased     Swallow Study Prior Functional Status   see HHX, poor appetite at home prior to admit    General Date of Onset: 11/14/13 HPI: Ms. Whitaker is a 80 y.o. female who initially presented complaining of increasing fatigue and weakness- s/p fall with HPI: this is a very pleasant woman who fell and was found to have a nondisplaced right greater trochanter fracture.  Chest x-ray on 10/16/2013 showed an abnormal masslike density in the left perihilar region.  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 Janace Hoard with a diagnosis of parotitis and no further information in Epic about this since 01/17/11 per surgery service note. Pt has been found to have a metastatic Ca of uncertain etiology per MD note, imaging studies.  MRI showed "Innumerable widespread intracranial metastatic deposits as described - Posterior fossa lesions predominate, with potential for obstructive hydrocephalus should the RIGHT cerebellar or LEFT paramedian pontine lesions significant enlarge. Subcentimeter foci of presumed acute hemorrhage are associated with a minority of lesions. Incompletely evaluated bulky cervical adenopathy."  Swallow evaluation ordered.   Type of Study: Bedside swallow evaluation Diet Prior to this Study: Regular;Thin liquids Temperature Spikes Noted: No Respiratory Status: Nasal cannula History of Recent Intubation: No Behavior/Cognition: Alert;Cooperative;Pleasant mood Oral Cavity - Dentition: Dentures, top;Dentures, bottom (pt does not use her dentures for po intake) Self-Feeding Abilities: Able to feed self Patient Positioning: Upright in bed Baseline Vocal Quality: Low vocal intensity;Hoarse (pt reports voice changes x3 weeks) Volitional Cough:  (pt declined politely to attempt cough, recent biopsy of groin region) Volitional Swallow: Able to elicit    Oral/Motor/Sensory Function Overall Oral  Motor/Sensory Function:  (limited due to pt eating meal upon SLP admit, oral pocketing on left noted, ? mildly decreased left facial/labial movement, area of enlargement - ? gland on left side of neck more than right below ear line) Velum:  (dnt, pt eating meal)   Ice Chips Ice chips: Not tested   Thin Liquid Presentation: Straw;Self Fed Other Comments: intermittent throat clearing with intake    Nectar Thick Nectar Thick Liquid: Not tested   Honey Thick Honey Thick Liquid: Not tested   Puree Puree: Within functional limits Presentation: Self Fed;Spoon Oral Phase Impairments: Impaired anterior to posterior transit Oral Phase Functional Implications: Oral residue;Prolonged oral transit;Left lateral sulci pocketing   Solid   GO    Solid: Impaired Presentation: Self Fed Oral Phase Impairments: Reduced lingual movement/coordination;Impaired anterior to posterior transit Oral Phase Functional Implications: Left lateral sulci pocketing;Oral residue Other Comments: pt reports at times she has to swish and expectorate with water to clear oral residuals from mouth after meals at baseline       Luanna Salk, [place] [facility] [place] [phone]

## 2013-11-14 NOTE — Progress Notes (Addendum)
Clinical Social Work Department CLINICAL SOCIAL WORK PLACEMENT NOTE 11/14/2013  Patient:  Sonia Weeks, Sonia Weeks  Account Number:  [id number] Admit date:  11/12/2013  Clinical Social Worker:  Ulyess Blossom  Date/time:  11/14/2013 04:28 PM  Clinical Social Work is seeking post-discharge placement for this patient at the following level of care:   SKILLED NURSING   (*CSW will update this form in Epic as items are completed)   11/14/2013  Patient/family provided with [place] Department of Clinical Social Work's list of facilities offering this level of care within the geographic area requested by the patient (or if unable, by the patient's family).  11/14/2013  Patient/family informed of their freedom to choose among providers that offer the needed level of care, that participate in Medicare, Medicaid or managed care program needed by the patient, have an available bed and are willing to accept the patient.  11/14/2013  Patient/family informed of [facility]' ownership interest in [facility], as well as of the fact that they are under no obligation to receive care at this facility.  PASARR submitted to EDS on 11/14/2013 PASARR number received on 11/14/2013  FL2 transmitted to all facilities in geographic area requested by pt/family on  11/14/2013 FL2 transmitted to all facilities within larger geographic area on   Patient informed that his/her managed care company has contracts with or will negotiate with  certain facilities, including the following:     Patient/family informed of bed offers received:  11/15/2013 Patient chooses bed at [facility] Physician recommends and patient chooses bed at    Patient to be transferred to  on   Patient to be transferred to facility by  Patient and family notified of transfer on  Name of family member notified:    The following physician request were entered in Epic:   Additional Comments:   Alison Murray,  MSW, [place] Work [phone]

## 2013-11-14 NOTE — Addendum Note (Signed)
Addendum created 11/14/13 1317 by Lollie Sails, CRNA   Modules edited: Charges VN

## 2013-11-14 NOTE — Progress Notes (Signed)
[facility] PROGRESS NOTE  Sonia Weeks VEH:[id number] DOB: 1927-02-22 DOA: 11/12/2013 PCP: Ricke Hey, MD  Assessment/Plan: Nondisplaced Fracture of the greater trochanter of the right hip  Secondary to mechanical fall and possibly pathological given recently diagnosed metastatic cancer.  Supportive care with pain control. Added bowel regimen. Dr Tonita Cong and orthopedics were consulted by ED and reportedly recommended medical management and PT as tolerated.  -PT/OT eval and DVT prophylaxis  Metastatic cancer of unknown primary  -Patient following with Dr. Julien Nordmann as outpatient. -MRI of the brain done showing innumerable widespread intracranial metastatic deposits without any vasogenic edema or midline shift. Also shows acute bilateral maxillary sinus and ethmoid disease along with cervical lymphadenopathy is also subcentimeter foci of acute hemorrhage associated with some of the lesion.  -Per Dr Lona Kettle who is covering Dr. Earlie Server, recommended radiation oncology consult. Dr [name] recommended starting on decadron 4mg  q 6 hr.  -Pt is now s/p biopsy of R groin nodule 10/27 - follow results generalised weakness with failure to thrive and moderate malnutrition  -Likely secondary to recently diagnosed metastatic cancer. Patient has poor by mouth intake with significant weight loss. Nutrition consult appreciated  -Added Ensure twice a day and Megace  Type 2 diabetes mellitus  Monitor on sliding scale insulin  Hypertension  Continue home therapy unable  Transaminitis  Likely secondary to metastases to the liver. monitor for now  Tobacco use  Counseled on suggestion. Continue nicotine patch  Code Status: DNR Family Communication: Pt and daughter at bedside (indicate person spoken with, relationship, and if by phone, the number) Disposition Plan: Discussed with family, daughter is very interested in discussing case with Palliative Care for Goals of Care meeting. Will  consult   Consultants:  Palliative Care  General Surgery  Radiation Oncology  Procedures:  R groin nodule biopsy 10/27  Antibiotics:    HPI/Subjective: No complaints. No acute events noted overnight  Objective: Filed Vitals:   11/14/13 1430 11/14/13 1530 11/14/13 1600 11/14/13 1645  BP: 187/93 183/85  185/88  Pulse: 85 95  96  Temp: 98.3 F (36.8 C) 98.5 F (36.9 C)  98 F (36.7 C)  TempSrc: Oral Oral  Oral  Resp: 14 16 18 15   Height:      Weight:      SpO2: 100% 97% 98% 94%    Intake/Output Summary (Last 24 hours) at 11/14/13 1803 Last data filed at 11/14/13 1300  Gross per 24 hour  Intake    840 ml  Output    125 ml  Net    715 ml   Filed Weights   11/12/13 1807  Weight: 57.1 kg (125 lb 14.1 oz)    Exam:   General:  Awake, in nad  Cardiovascular: regular, s1, s2  Respiratory: normal resp effort, no wheezing  Abdomen: soft,nondistended  Musculoskeletal: perfused, no clubbing   Data Reviewed: Basic Metabolic Panel:  Recent Labs Lab 11/12/13 1114 11/12/13 1810 11/13/13 0345  NA 138  --  135*  K 4.2  --  3.9  CL 98  --  98  CO2 27  --  24  GLUCOSE 116*  --  101*  BUN 15  --  12  CREATININE 0.86 0.84 0.78  CALCIUM 9.4  --  8.7   Liver Function Tests:  Recent Labs Lab 11/12/13 1114 11/13/13 0345  AST 213* 185*  ALT 136* 156*  ALKPHOS 294* 301*  BILITOT 1.0 1.8*  PROT 8.4* 7.0  ALBUMIN 3.1* 2.4*   No results found  for this basename: LIPASE, AMYLASE,  in the last 168 hours No results found for this basename: AMMONIA,  in the last 168 hours CBC:  Recent Labs Lab 11/12/13 1114 11/12/13 1810 11/19/2013 0345  WBC 9.2 8.1 7.7  NEUTROABS 6.4  --   --   HGB 12.7 11.3* 11.1*  HCT 37.0 33.7* 32.9*  MCV 92.5 93.9 92.7  PLT 268 209 243   Cardiac Enzymes:  Recent Labs Lab 11/12/13 1114  TROPONINI <0.30   BNP (last 3 results)  Recent Labs  10/16/13 1320  PROBNP 1161.0*   CBG:  Recent Labs Lab Nov 19, 2013 1643  11-19-2013 2156 11/14/13 0731 11/14/13 1239 11/14/13 1657  GLUCAP 115* 162* 156* 138* 154*    Recent Results (from the past 240 hour(s))  URINE CULTURE     Status: None   Collection Time    11/12/13 12:01 PM      Result Value Ref Range Status   Specimen Description URINE, CATHETERIZED   Final   Special Requests NONE   Final   Culture  Setup Time     Final   Value: 11/12/2013 23:05     Performed at [facility]     Final   Value: NO GROWTH     Performed at [facility]   Culture     Final   Value: NO GROWTH     Performed at [facility]   Report Status 11/14/2013 FINAL   Final  SURGICAL PCR SCREEN     Status: Abnormal   Collection Time    11/14/13  5:03 AM      Result Value Ref Range Status   MRSA, PCR NEGATIVE  NEGATIVE Final   Staphylococcus aureus POSITIVE (*) NEGATIVE Final   Comment:            The Xpert SA Assay (FDA     approved for NASAL specimens     in patients over 78 years of age),     is one component of     a comprehensive surveillance     program.  Test performance has     been validated by [employer] for patients greater     than or equal to 46 year old.     It is not intended     to diagnose infection nor to     guide or monitor treatment.     Studies: Mr Kizzie Fantasia Contrast  2013/11/19   CLINICAL DATA:  Staging of widely metastatic disease of unknown primary, pending tissue diagnosis.  EXAM: MRI HEAD WITHOUT AND WITH CONTRAST  TECHNIQUE: Multiplanar, multiecho pulse sequences of the brain and surrounding structures were obtained without and with intravenous contrast.  CONTRAST:  56mL MULTIHANCE GADOBENATE DIMEGLUMINE 529 MG/ML IV SOLN  COMPARISON:  CT head 11/12/2013.  FINDINGS: There are innumerable widespread intracranial metastatic deposits which are concentrated in the posterior fossa. The largest lesion involves the RIGHT cerebellar hemisphere, and is surrounded by mild vasogenic edema with slight mass  effect on the fourth ventricle. It measures 21 x 22 x 20 mm (R-L x A-P x C-C). The largest brainstem lesion is seen in the LEFT paramedian pons measuring 15 x 13 x 13 mm. Extensive widespread supratentorial metastatic lesions are largely subcentimeter in size. The majority lesions show contrast enhancement with central necrosis although some are solid.  Subcentimeter foci of presumed acute hemorrhage (or melanin) is associated with only a small proportion  of lesions, including the LEFT paramedian pontine lesion, the LEFT cerebellar peripheral hemispheric lesion, a LEFT frontal subependymal lesion near the caudate, as well as a LEFT posterior frontal cortical lesion.  Generalized atrophy. Chronic microvascular ischemic change in addition to T2 and FLAIR hyperintensity surrounding the multiple metastatic deposits. No tonsillar herniation. No pituitary lesion. No impending hydrocephalus. No midline shift. Flow voids are maintained. Negative orbits. Acute BILATERAL maxillary sinus and ethmoid disease. Slight heterogeneity of marrow signal without focal osseous lesion. No definite meningeal enhancement.  There is bulky cervical lymphadenopathy incompletely evaluated. As seen on coronal imaging, presumed lymphadenopathy is intimately involved with both the LEFT and RIGHT parotid glands. BILATERAL parotid masses are not excluded, but given the findings on chest and abdomen CT, cervical lymphadenopathy is favored.  IMPRESSION: Innumerable widespread intracranial metastatic deposits as described. Posterior fossa lesions predominate, with potential for obstructive hydrocephalus should the RIGHT cerebellar or LEFT paramedian pontine lesions significant enlarge.  Subcentimeter foci of presumed acute hemorrhage are associated with a minority of lesions.  Incompletely evaluated bulky cervical adenopathy.   Electronically Signed   By: Rolla Flatten M.D.   On: 11/13/2013 08:20   Chest Portable 1 View  11/12/2013   CLINICAL DATA:   Smoker, preoperative evaluation for hip fracture. Personal history hypertension, diabetes, smoking, metastatic cancer  EXAM: PORTABLE CHEST - 1 VIEW  COMPARISON:  Portable exam 1759 hr compared to 10/16/2013  FINDINGS: Normal heart size, mediastinal contours and pulmonary vascularity.  Atherosclerotic calcification aortic arch.  LEFT perihilar mass again identified, poorly defined, appears slightly more prominent versus prior study though this may be related to AP versus PA technique.  Few poorly defined pulmonary nodules are questioned in the lower RIGHT lung.  Underlying emphysematous changes.  No pleural effusion or pneumothorax.  Bones demineralized.  IMPRESSION: Emphysematous changes with persistent RIGHT perihilar mass in question subtle nodular densities in the lower RIGHT lung.   Electronically Signed   By: Lavonia Dana M.D.   On: 11/12/2013 18:29    Scheduled Meds: . Chlorhexidine Gluconate Cloth  6 each Topical Daily  . dexamethasone  4 mg Oral 4 times per day  . docusate sodium  100 mg Oral BID  . enoxaparin (LOVENOX) injection  40 mg Subcutaneous Q24H  . feeding supplement (ENSURE COMPLETE)  237 mL Oral BID BM  . Influenza vac split quadrivalent PF  0.5 mL Intramuscular Tomorrow-1000  . insulin aspart  0-15 Units Subcutaneous TID WC  . insulin aspart  0-5 Units Subcutaneous QHS  . megestrol  40 mg Oral Daily  . mupirocin ointment  1 application Nasal BID  . nicotine  14 mg Transdermal Daily  . pantoprazole  40 mg Oral Q0600  . pneumococcal 23 valent vaccine  0.5 mL Intramuscular Tomorrow-1000  . verapamil  180 mg Oral QHS   Continuous Infusions: . sodium chloride 75 mL/hr at 11/13/13 1236    Principal Problem:   Fracture of greater trochanter of right femur Active Problems:   Hypertension   Metastatic cancer   Transaminitis   FTT (failure to thrive) in adult   Weakness generalized   Diabetes mellitus   Arthritis   Hip fracture, right   Malnutrition of moderate degree    Metastasis to brain   Time spent: 52min   [facility], [facility] Pager [phone]. If 7PM-7AM, please contact night-coverage at [email or web address], password [facility] 11/14/2013, 6:03 PM  LOS: 2 days

## 2013-11-15 ENCOUNTER — Encounter: Payer: Self-pay | Admitting: Radiation Oncology

## 2013-11-15 ENCOUNTER — Inpatient Hospital Stay (HOSPITAL_COMMUNITY): Payer: Medicare Other

## 2013-11-15 ENCOUNTER — Telehealth: Payer: Self-pay | Admitting: *Deleted

## 2013-11-15 ENCOUNTER — Ambulatory Visit (HOSPITAL_COMMUNITY): Admission: RE | Admit: 2013-11-15 | Payer: Medicare Other | Source: Ambulatory Visit

## 2013-11-15 ENCOUNTER — Ambulatory Visit
Admit: 2013-11-15 | Discharge: 2013-11-15 | Disposition: A | Discharge status: Home / Self Care | Payer: Medicare Other | Attending: Radiation Oncology | Admitting: Radiation Oncology

## 2013-11-15 DIAGNOSIS — G893 Neoplasm related pain (acute) (chronic): Secondary | ICD-10-CM

## 2013-11-15 DIAGNOSIS — C801 Malignant (primary) neoplasm, unspecified: Secondary | ICD-10-CM | POA: Insufficient documentation

## 2013-11-15 DIAGNOSIS — C7931 Secondary malignant neoplasm of brain: Secondary | ICD-10-CM

## 2013-11-15 DIAGNOSIS — Z515 Encounter for palliative care: Secondary | ICD-10-CM

## 2013-11-15 DIAGNOSIS — Z510 Encounter for antineoplastic radiation therapy: Secondary | ICD-10-CM | POA: Insufficient documentation

## 2013-11-15 LAB — CBC
HCT: 32.9 % — ABNORMAL LOW (ref 36.0–46.0)
Hemoglobin: 11.3 g/dL — ABNORMAL LOW (ref 12.0–15.0)
MCH: 31 pg (ref 26.0–34.0)
MCHC: 34.3 g/dL (ref 30.0–36.0)
MCV: 90.4 fL (ref 78.0–100.0)
PLATELETS: 252 10*3/uL (ref 150–400)
RBC: 3.64 MIL/uL — ABNORMAL LOW (ref 3.87–5.11)
RDW: 12.1 % (ref 11.5–15.5)
WBC: 13.4 10*3/uL — ABNORMAL HIGH (ref 4.0–10.5)

## 2013-11-15 LAB — COMPREHENSIVE METABOLIC PANEL
ALT: 73 U/L — ABNORMAL HIGH (ref 0–35)
ANION GAP: 13 (ref 5–15)
AST: 44 U/L — ABNORMAL HIGH (ref 0–37)
Albumin: 2.5 g/dL — ABNORMAL LOW (ref 3.5–5.2)
Alkaline Phosphatase: 207 U/L — ABNORMAL HIGH (ref 39–117)
BILIRUBIN TOTAL: 0.6 mg/dL (ref 0.3–1.2)
BUN: 15 mg/dL (ref 6–23)
CHLORIDE: 98 meq/L (ref 96–112)
CO2: 23 mEq/L (ref 19–32)
Calcium: 8.4 mg/dL (ref 8.4–10.5)
Creatinine, Ser: 0.78 mg/dL (ref 0.50–1.10)
GFR, EST AFRICAN AMERICAN: 87 mL/min — AB (ref >90)
GFR, EST NON AFRICAN AMERICAN: 75 mL/min — AB (ref >90)
GLUCOSE: 150 mg/dL — AB (ref 70–99)
Potassium: 4 mEq/L (ref 3.7–5.3)
Sodium: 134 mEq/L — ABNORMAL LOW (ref 137–147)
Total Protein: 7.3 g/dL (ref 6.0–8.3)

## 2013-11-15 LAB — GLUCOSE, CAPILLARY
GLUCOSE-CAPILLARY: 139 mg/dL — AB (ref 70–99)
GLUCOSE-CAPILLARY: 133 mg/dL — AB (ref 70–99)
Glucose-Capillary: 147 mg/dL — ABNORMAL HIGH (ref 70–99)
Glucose-Capillary: 141 mg/dL — ABNORMAL HIGH (ref 70–99)

## 2013-11-15 MED ORDER — OXYCODONE-ACETAMINOPHEN 5-325 MG PO TABS
2.0000 | ORAL_TABLET | ORAL | Status: DC | PRN
  Administered 2013-11-15 – 2013-11-17 (×6): 2 via ORAL
  Filled 2013-11-15 (×6): qty 2

## 2013-11-15 MED ORDER — BISACODYL 10 MG RE SUPP
10.0000 mg | Freq: Every day | RECTAL | Status: DC | PRN
  Administered 2013-11-17: 10 mg via RECTAL
  Filled 2013-11-15: qty 1

## 2013-11-15 NOTE — Progress Notes (Signed)
OT Cancellation Note  Patient Details Name: ISIDORA LAHAM MRN: [id number] DOB: 04-20-1927   Cancelled Treatment:    Reason Eval/Treat Not Completed: Other (comment).  Pt is in XRT.  Will try to check back later today.  Jayde Daffin 11/15/2013, 10:55 AM Lesle Chris, OTR/L [phone] 11/15/2013

## 2013-11-15 NOTE — Telephone Encounter (Signed)
Called daughter to cancel appt with Dr. Julien Nordmann.  He saw patient in the hospital.  Unable to leave a message.

## 2013-11-15 NOTE — Progress Notes (Signed)
  Radiation Oncology         [phone] ________________________________  Name: Sonia Weeks MRN: [id number]  Date: 11/15/2013  DOB: July 03, 1927  Inpatient  SIMULATION AND TREATMENT PLANNING NOTE    ICD-9-CM ICD-10-CM  1. Metastasis to brain 198.3 C79.31    DIAGNOSIS:  80 year old woman with numerous brain metastases  NARRATIVE:  The patient was brought to the [facility].  Identity was confirmed.  All relevant records and images related to the planned course of therapy were reviewed.  The patient freely provided informed written consent to proceed with treatment after reviewing the details related to the planned course of therapy. The consent form was witnessed and verified by the simulation staff.  Then, the patient was set-up in a stable reproducible  supine position for radiation therapy.  CT images were obtained.  Surface markings were placed.  The CT images were loaded into the planning software.  Then the target and avoidance structures were contoured.  Treatment planning then occurred.  The radiation prescription was entered and confirmed.  Then, I designed and supervised the construction of a total of 3 medically necessary complex treatment devices including thermoplastic face mask for positioning as well as to multileaf collimators to shape radiation around the entire brain wall shielding the eyes and face..  I have requested : Isodose Plan.  I have ordered:Nutrition Consult  PLAN:  The patient will receive 35 Gy in 14 fraction.  ________________________________  Sheral Apley [name], M.D.

## 2013-11-15 NOTE — Telephone Encounter (Signed)
Called left ms moss a voice message of appt cancellation

## 2013-11-15 NOTE — Progress Notes (Signed)
[facility] PROGRESS NOTE  Sonia Weeks SPQ:[id number] DOB: [birth year] DOA: 11/12/2013 PCP: Ricke Hey, MD  Assessment/Plan: Nondisplaced Fracture of the greater trochanter of the right hip  Secondary to mechanical fall and possibly pathological given recently diagnosed metastatic cancer.  Supportive care with pain control. Added bowel regimen. Dr Tonita Cong and orthopedics were consulted by ED and reportedly recommended medical management and PT as tolerated.  -PT/OT eval and DVT prophylaxis  Metastatic cancer of unknown primary  -Patient following with Dr. Julien Nordmann as outpatient. -MRI of the brain done showing innumerable widespread intracranial metastatic deposits without any vasogenic edema or midline shift. Also shows acute bilateral maxillary sinus and ethmoid disease along with cervical lymphadenopathy is also subcentimeter foci of acute hemorrhage associated with some of the lesion.  -Per Dr Lona Kettle who is covering Dr. Earlie Server, recommended radiation oncology consult. Dr [name] recommended starting on decadron 4mg  q 6 hr.  -Pt is now s/p biopsy of R groin nodule 10/27 - follow results -Pt planned for radiation tx generalised weakness with failure to thrive and moderate malnutrition  -Likely secondary to recently diagnosed metastatic cancer. Patient has poor by mouth intake with significant weight loss. Nutrition consult appreciated  -Added Ensure twice a day and Megace  Type 2 diabetes mellitus  Monitor on sliding scale insulin  Hypertension  Continue home therapy unable  Transaminitis  Likely secondary to metastases to the liver. monitor for now  Tobacco use  Counseled on suggestion. Continue nicotine patch  Code Status: DNR Family Communication: Pt and daughter at bedside Disposition Plan: Pending   Consultants:  Palliative Care  General Surgery  Radiation Oncology  Procedures:  R groin nodule biopsy  10/27  Antibiotics:    HPI/Subjective: Complains of mild abd pain. No acute events noted overnight  Objective: Filed Vitals:   11/15/13 0400 11/15/13 0539 11/15/13 0953 11/15/13 1549  BP:  180/87 185/94 181/82  Pulse:  87 81 92  Temp:  97.9 F (36.6 C) 99 F (37.2 C) 99.4 F (37.4 C)  TempSrc:  Oral Oral Oral  Resp: 16 16 16 16   Height:      Weight:      SpO2: 95% 98% 99% 95%    Intake/Output Summary (Last 24 hours) at 11/15/13 1811 Last data filed at 11/15/13 0531  Gross per 24 hour  Intake   1317 ml  Output    350 ml  Net    967 ml   Filed Weights   11/12/13 1807  Weight: 57.1 kg (125 lb 14.1 oz)    Exam:   General:  Awake, in nad  Cardiovascular: regular, s1, s2  Respiratory: normal resp effort, no wheezing  Abdomen: soft,nondistended  Musculoskeletal: perfused, no clubbing   Data Reviewed: Basic Metabolic Panel:  Recent Labs Lab 11/12/13 1114 11/12/13 1810 11/13/13 0345 11/15/13 0510  NA 138  --  135* 134*  K 4.2  --  3.9 4.0  CL 98  --  98 98  CO2 27  --  24 23  GLUCOSE 116*  --  101* 150*  BUN 15  --  12 15  CREATININE 0.86 0.84 0.78 0.78  CALCIUM 9.4  --  8.7 8.4   Liver Function Tests:  Recent Labs Lab 11/12/13 1114 11/13/13 0345 11/15/13 0510  AST 213* 185* 44*  ALT 136* 156* 73*  ALKPHOS 294* 301* 207*  BILITOT 1.0 1.8* 0.6  PROT 8.4* 7.0 7.3  ALBUMIN 3.1* 2.4* 2.5*   No results found for this basename: LIPASE, AMYLASE,  in the last 168 hours No results found for this basename: AMMONIA,  in the last 168 hours CBC:  Recent Labs Lab 11/12/13 1114 11/12/13 1810 11/13/13 0345 11/26/2013 0510  WBC 9.2 8.1 7.7 13.4*  NEUTROABS 6.4  --   --   --   HGB 12.7 11.3* 11.1* 11.3*  HCT 37.0 33.7* 32.9* 32.9*  MCV 92.5 93.9 92.7 90.4  PLT 268 209 243 252   Cardiac Enzymes:  Recent Labs Lab 11/12/13 1114  TROPONINI <0.30   BNP (last 3 results)  Recent Labs  10/16/13 1320  PROBNP 1161.0*   CBG:  Recent Labs Lab  11/14/13 1657 11/14/13 2154 26-Nov-2013 0743 November 26, 2013 1237 November 26, 2013 1716  GLUCAP 154* 172* 139* 147* 141*    Recent Results (from the past 240 hour(s))  URINE CULTURE     Status: None   Collection Time    11/12/13 12:01 PM      Result Value Ref Range Status   Specimen Description URINE, CATHETERIZED   Final   Special Requests NONE   Final   Culture  Setup Time     Final   Value: 11/12/2013 23:05     Performed at [facility]     Final   Value: NO GROWTH     Performed at [facility]   Culture     Final   Value: NO GROWTH     Performed at [facility]   Report Status 11/14/2013 FINAL   Final  SURGICAL PCR SCREEN     Status: Abnormal   Collection Time    11/14/13  5:03 AM      Result Value Ref Range Status   MRSA, PCR NEGATIVE  NEGATIVE Final   Staphylococcus aureus POSITIVE (*) NEGATIVE Final   Comment:            The Xpert SA Assay (FDA     approved for NASAL specimens     in patients over 83 years of age),     is one component of     a comprehensive surveillance     program.  Test performance has     been validated by [employer] for patients greater     than or equal to 2 year old.     It is not intended     to diagnose infection nor to     guide or monitor treatment.     Studies: US Abdomen Limited Ruq  2013-11-26   CLINICAL DATA:  Right upper quadrant pain, history of metastatic disease of unknown primary.  EXAM: US ABDOMEN LIMITED - RIGHT UPPER QUADRANT  COMPARISON:  10/16/2013  FINDINGS: Gallbladder:  No definitive gallstones are identified. Echogenic areas are noted within the gallbladder wall with ring down artifact consistent with adenomyomatosis.  Common bile duct:  Diameter: 8.5 mm. This was prominent on the prior CT examination as well.  Liver:  Heterogeneity is identified. There are multiple slightly hyperechoic lesions identified within the right lobe of the liver diffuse smaller lesions noted in the left lobe of  the liver. These are consistent with metastatic disease appear to have increased slightly in the interval from the prior exam. The largest of these lesions measures 2.4 cm in greatest dimension.  Note is made of a subcutaneous hypoechoic nodule which corresponds to a lesion seen on prior CT examination. It is increased in size to 12 mm from 9 mm.  IMPRESSION: Increasing hepatic metastatic  disease.  Increasing size in a subcutaneous nodule consistent with metastatic disease.  Adenomyomatosis within the gallbladder.  Prominent common bile duct stable from the prior CT.   Electronically Signed   By: Inez Catalina M.D.   On: 11/15/2013 11:23    Scheduled Meds: . Chlorhexidine Gluconate Cloth  6 each Topical Daily  . dexamethasone  4 mg Oral 4 times per day  . docusate sodium  100 mg Oral BID  . enoxaparin (LOVENOX) injection  40 mg Subcutaneous Q24H  . feeding supplement (ENSURE COMPLETE)  237 mL Oral BID BM  . Influenza vac split quadrivalent PF  0.5 mL Intramuscular Tomorrow-1000  . insulin aspart  0-15 Units Subcutaneous TID WC  . insulin aspart  0-5 Units Subcutaneous QHS  . megestrol  40 mg Oral Daily  . mupirocin ointment  1 application Nasal BID  . nicotine  14 mg Transdermal Daily  . pantoprazole  40 mg Oral Q0600  . pneumococcal 23 valent vaccine  0.5 mL Intramuscular Tomorrow-1000  . verapamil  180 mg Oral QHS   Continuous Infusions: . sodium chloride 75 mL/hr at 11/15/13 0531    Principal Problem:   Fracture of greater trochanter of right femur Active Problems:   Hypertension   Metastatic cancer   Transaminitis   FTT (failure to thrive) in adult   Weakness generalized   Diabetes mellitus   Arthritis   Hip fracture, right   Malnutrition of moderate degree   Metastasis to brain   Time spent: 70min   Lenwood Balsam, [facility] Pager [phone]. If 7PM-7AM, please contact night-coverage at [email or web address], password [facility] 11/15/2013, 6:11 PM  LOS: 3 days

## 2013-11-15 NOTE — Progress Notes (Signed)
CSW continuing to follow for disposition planning.  CSW met with pt at bedside. CSW discussed with pt the SNF bed offers.   Pt discussed that her daughter, Arbie Cookey will arrive to hospital around 2 pm. Pt shared that she is deferring decisions to her daughter regarding SNF placement.  CSW provided emotional support as pt shared feeling surrounding her current medical conditions and beginning radiation.   CSW left list of bed offers at bedside.  CSW to follow up and meet with pt daughter when pt daughter arrives to the hospital.  CSW to continue to follow.  Alison Murray, MSW, [place] [employer] [phone]

## 2013-11-15 NOTE — Progress Notes (Addendum)
CSW continuing to follow.  CSW attempted to follow up with pt and pt daughter at bedside regarding disposition planning.  Palliative Care Meeting in progress at this time.  CSW to follow up with pt and pt daughter at a later time.  Addendum 3:44 pm:  CSW met with pt and pt daughter at bedside.  CSW discussed SNF bed offers.  Pt and pt daughter agreeable to [facility].  CSW notified [facility] regarding acceptance of bed offer.  CSW to continue to follow.  Alison Murray, MSW, [place] [facility] [phone]

## 2013-11-15 NOTE — Consult Note (Signed)
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  Patient and family are faced with advanced directive decisions and anticipatory care needs   Consult is for review of medical treatment options, clarification of goals of care and end of life issues, disposition and options, and symptom recommendation.  This NP Sonia Weeks reviewed medical records, received report from team, assessed the patient and then meet at the patient's bedside along with her daughter Sonia Weeks  to discuss diagnosis prognosis, [place], EOL wishes disposition and options.  A detailed discussion was had today regarding advanced directives.  Concepts specific to code status, artifical feeding and hydration, continued IV antibiotics and rehospitalization was had.  The difference between a aggressive medical intervention path  and a palliative comfort care path for this patient at this time was had.  Values and goals of care important to patient and family were attempted to be elicited.  Concept of Hospice and Palliative Care were discussed  Natural trajectory and expectations at EOL were discussed.  Questions and concerns addressed.  Hard Choices booklet left for review. Family encouraged to call with questions or concerns.  PMT will continue to support holistically.   Goals of Care: 1.  Code Status: DNR/DNI   2. Scope of Treatment:  Patient is in the process of processing her serious life threatening illness and treatment options.  Although she is leaning toward a comfort path, she is hopeful to speak with Dr Sonia Weeks here in the hospital before discharge  to determine possible systemic therapies and what that would entail.  She is open to recommended palliative radiation for brain metastasis     3. Disposition:  Likely to SNF for rehabilitation with transport to radiation therapy treatments. Ultimate goal is home and are open to hospice services   4. Symptom Management:    1. Pain/cancer related:  Percocet 2 tablets po every 4 hrs prn 2.  Weakness: PT/OT as tolerated   3. Bowel Regimen:Dulcolax supp daily prn   5. Psychosocial:  Emotional support offered to patient and her daughter.  Both are realistic regarding the limited prognosis  6. Spiritual:  Chaplin services consulted   Patient Documents Completed or Given: Document Given Completed  Advanced Directives Pkt    MOST X   DNR    Gone from My Sight    Hard Choices      Brief HPI:  Sonia Weeks is a 80 y.o. female with heavy smoking history and past medical history significant for hypertension, diabetes mellitus, Parotitis and osteoarthritis. She is also legally blind in both eyes since 1972. The patient mentions that for the last 2 months she has been complaining of increasing fatigue and weakness. She was evaluated at the emergency Department at [facility]. Chest x-ray on 10/16/2013 showed an abnormal masslike density in the left perihilar region. This was followed by CT scan of the chest, abdomen and pelvis which showed several findings including lymphadenopathy in the chest. Index left axillary node measures 1.8 x 1.0 cm. A precarinal node, measures 2.7 x 2.4 cm. Right infrahilar  node measures 1.4 x 1.6 cm. AP window node measures 2.4 x 1.7 cm. A left upper lobe mass in the hilum measures 2.7 x 3.2 cm in addition to other small nodules in the right middle lobe and right lung base contiguous with the pleura measuring 1.3 x 0.8 CM. In the abdomen there was also hypoattenuating lesion in the right hepatic lobe measuring 1.7 CM and a second hypoattenuating lesion  measuring 0.6 CM. There was also bilateral adrenal gland lesion and a lesion in the left kidney measuring 2.2 x 1.5 CM. There was also extensive lymphadenopathy in the abdomen. The left ovary has an abnormal appearance measuring 2.8 x 4.0 cm. The right ovary is also somewhat prominent measuring 1.9 x 3.0 cm. The colon is unremarkable.  Now with liver and brain metastasis    ROS:  Weakness,  Weight loss, decreased po intake, hoarseness    PMH:  Past Medical History  Diagnosis Date  . Hypertension   . Diabetes mellitus   . Arthritis   . Metastatic cancer 10/26/2013     PSH: Past Surgical History  Procedure Laterality Date  . Ectopic pregnancy surgery     I have reviewed the FH and SH and  If appropriate update it with new information. No Known Allergies Scheduled Meds: . Chlorhexidine Gluconate Cloth  6 each Topical Daily  . dexamethasone  4 mg Oral 4 times per day  . docusate sodium  100 mg Oral BID  . enoxaparin (LOVENOX) injection  40 mg Subcutaneous Q24H  . feeding supplement (ENSURE COMPLETE)  237 mL Oral BID BM  . Influenza vac split quadrivalent PF  0.5 mL Intramuscular Tomorrow-1000  . insulin aspart  0-15 Units Subcutaneous TID WC  . insulin aspart  0-5 Units Subcutaneous QHS  . megestrol  40 mg Oral Daily  . mupirocin ointment  1 application Nasal BID  . nicotine  14 mg Transdermal Daily  . pantoprazole  40 mg Oral Q0600  . pneumococcal 23 valent vaccine  0.5 mL Intramuscular Tomorrow-1000  . verapamil  180 mg Oral QHS   Continuous Infusions: . sodium chloride 75 mL/hr at 11/15/13 0531   PRN Meds:.albuterol, HYDROcodone-acetaminophen, ibuprofen, ipratropium-albuterol, methocarbamol (ROBAXIN) IV, methocarbamol, morphine injection, naproxen, ondansetron (ZOFRAN) IV, polyethylene glycol, sorbitol, zolpidem    BP 185/94  Pulse 81  Temp(Src) 99 F (37.2 C) (Oral)  Resp 16  Ht 5\' 2"  (1.575 m)  Wt 57.1 kg (125 lb 14.1 oz)  BMI 23.02 kg/m2  SpO2 99%   PPS:  30  %   Intake/Output Summary (Last 24 hours) at 11/15/13 1105 Last data filed at 11/15/13 0531  Gross per 24 hour  Intake   2157 ml  Output    475 ml  Net   1682 ml    Physical Exam:  General: chronically ill appearing, NAD HEENT:  Moist buccal membranes,  Large bilateral  nodules  Chest:   CTA CVS: RRR Abdomen:soft NT +BS Ext: without edema Neuro: alert and oriented X3  Labs: CBC    Component Value Date/Time   WBC 13.4* 11/15/2013 0510   RBC 3.64* 11/15/2013 0510   HGB 11.3* 11/15/2013 0510   HCT 32.9* 11/15/2013 0510   PLT 252 11/15/2013 0510   MCV 90.4 11/15/2013 0510   MCH 31.0 11/15/2013 0510   MCHC 34.3 11/15/2013 0510   RDW 12.1 11/15/2013 0510   LYMPHSABS 1.7 11/12/2013 1114   MONOABS 0.7 11/12/2013 1114   EOSABS 0.3 11/12/2013 1114   BASOSABS 0.1 11/12/2013  1114    BMET    Component Value Date/Time   NA 134* 11/15/2013 0510   K 4.0 11/15/2013 0510   CL 98 11/15/2013 0510   CO2 23 11/15/2013 0510   GLUCOSE 150* 11/15/2013 0510   BUN 15 11/15/2013 0510   CREATININE 0.78 11/15/2013 0510   CALCIUM 8.4 11/15/2013 0510   GFRNONAA 75* 11/15/2013 0510   GFRAA 87* 11/15/2013 0510    CMP     Component Value Date/Time   NA 134* 11/15/2013 0510   K 4.0 11/15/2013 0510   CL 98 11/15/2013 0510   CO2 23 11/15/2013 0510   GLUCOSE 150* 11/15/2013 0510   BUN 15 11/15/2013 0510   CREATININE 0.78 11/15/2013 0510   CALCIUM 8.4 11/15/2013 0510   PROT 7.3 11/15/2013 0510   ALBUMIN 2.5* 11/15/2013 0510   AST 44* 11/15/2013 0510   ALT 73* 11/15/2013 0510   ALKPHOS 207* 11/15/2013 0510   BILITOT 0.6 11/15/2013 0510   GFRNONAA 75* 11/15/2013 0510   GFRAA 87* 11/15/2013 0510     Time In Time Out Total Time Spent with Patient Total Overall Time  1400 1530 80 min 90 min    Greater than 50%  of this time was spent counseling and coordinating care related to the above assessment and plan.   Sonia Lessen NP  Palliative Medicine Team Team Phone #  [phone] Pager [phone]  Discussed with Dr Sonia Weeks

## 2013-11-15 NOTE — Progress Notes (Signed)
Some dried bloody drainage on groin dressing.  Pathology pending.

## 2013-11-16 ENCOUNTER — Encounter: Payer: Self-pay | Admitting: Radiation Oncology

## 2013-11-16 ENCOUNTER — Ambulatory Visit: Payer: Medicare Other | Admitting: Internal Medicine

## 2013-11-16 ENCOUNTER — Telehealth: Payer: Self-pay | Admitting: Medical Oncology

## 2013-11-16 ENCOUNTER — Ambulatory Visit
Admit: 2013-11-16 | Discharge: 2013-11-16 | Disposition: A | Discharge status: Home / Self Care | Payer: Medicare Other | Attending: Radiation Oncology | Admitting: Radiation Oncology

## 2013-11-16 DIAGNOSIS — C7931 Secondary malignant neoplasm of brain: Secondary | ICD-10-CM

## 2013-11-16 DIAGNOSIS — S72111A Displaced fracture of greater trochanter of right femur, initial encounter for closed fracture: Secondary | ICD-10-CM | POA: Diagnosis not present

## 2013-11-16 DIAGNOSIS — Z7189 Other specified counseling: Secondary | ICD-10-CM

## 2013-11-16 DIAGNOSIS — G893 Neoplasm related pain (acute) (chronic): Secondary | ICD-10-CM

## 2013-11-16 DIAGNOSIS — Z515 Encounter for palliative care: Secondary | ICD-10-CM

## 2013-11-16 LAB — GLUCOSE, CAPILLARY
GLUCOSE-CAPILLARY: 138 mg/dL — AB (ref 70–99)
GLUCOSE-CAPILLARY: 123 mg/dL — AB (ref 70–99)
Glucose-Capillary: 191 mg/dL — ABNORMAL HIGH (ref 70–99)
Glucose-Capillary: 119 mg/dL — ABNORMAL HIGH (ref 70–99)

## 2013-11-16 MED ORDER — VERAPAMIL HCL ER 240 MG PO TBCR
240.0000 mg | EXTENDED_RELEASE_TABLET | Freq: Every day | ORAL | Status: DC
  Administered 2013-11-17: 240 mg via ORAL
  Filled 2013-11-16 (×2): qty 1

## 2013-11-16 NOTE — Progress Notes (Signed)
Patient seen and examined.  Agree with PA's note.  

## 2013-11-16 NOTE — Progress Notes (Signed)
[facility] Therapy Treatment Record Phone [phone]   Radiation Therapy was administered to Sonia Weeks on: 11/16/2013  4:15 PM and was treatment # 1 out of a planned course of 14 treatments.

## 2013-11-16 NOTE — Evaluation (Signed)
Occupational Therapy Evaluation Patient Details Name: Sonia Weeks MRN: [id number] DOB: 1928-01-18 Today's Date: 11/16/2013    History of Present Illness 80 yo female admitted with R greater trochanter fx after sustaining fall at home. Hx of HTN, DM, met cancer with primary unknown. MRI 10/26 + for brain mets.    Clinical Impression   Pt was admitted s/p fall resulting in R greater trochanter fx. She is TDWB.  Pt was found to have metastatic CA with brain mets this admission.  She will benefit from skilled OT to increase safety and independence with adls, following her weight bearing precaution.  Goals in acute are for min A to min guard.  She currently needs A x 2, min to occasional mod A, for weight bearing.      Follow Up Recommendations  SNF    Equipment Recommendations  3 in 1 bedside comode    Recommendations for Other Services       Precautions / Restrictions Precautions Precautions: Fall Restrictions RLE Weight Bearing: Touchdown weight bearing      Mobility Bed Mobility         Supine to sit: Min assist Sit to supine: Min assist   General bed mobility comments: Assist for LEs. Increased time.   Transfers   Equipment used: Conservation officer, nature (2 wheeled) Transfers: Sit to/from [employer] to Stand: +2 physical assistance;Min assist Stand pivot transfers: +2 physical assistance;Min assist       General transfer comment: second person to help ensure TDWB.  Tried squat pivot first time but pt stood.  Used walker on way back    Balance                                            ADL Overall ADL's : Needs assistance/impaired     Grooming: Set up;Sitting;Wash/dry hands       Lower Body Bathing: +2 for physical assistance;Minimal assistance;Sit to/from stand       Lower Body Dressing: +2 for physical assistance;Moderate assistance;Sit to/from stand   Toilet Transfer: +2 for physical assistance;Minimal  assistance;BSC;Stand-pivot   Toileting- Clothing Manipulation and Hygiene: +2 for physical assistance;Minimal assistance;Sit to/from stand         General ADL Comments: +2 assist for transfers and LB adls to help keep pt TDWB.  Pt oriented to self, place, and conditions:  had difficulty following cues for TDWB.       Vision                     Perception     Praxis      Pertinent Vitals/Pain Pain Score: 0-No pain     Hand Dominance     Extremity/Trunk Assessment Upper Extremity Assessment Upper Extremity Assessment: Overall WFL for tasks assessed           Communication Communication Communication: No difficulties   Cognition Arousal/Alertness: Awake/alert Behavior During Therapy: WFL for tasks assessed/performed Overall Cognitive Status: No family/caregiver present to determine baseline cognitive functioning Area of Impairment: Following commands;Safety/judgement               General Comments: pt had difficulty following cues for TDWB/sliding foot forward.  Oriented x r   General Comments       Exercises       Shoulder Instructions      Home Living Family/patient expects to be  discharged to:: Skilled nursing facility                                        Prior Functioning/Environment Level of Independence: Independent with assistive device(s)             OT Diagnosis: Generalized weakness;Cognitive deficits   OT Problem List: Decreased strength;Decreased activity tolerance;Impaired balance (sitting and/or standing);Impaired vision/perception;Decreased cognition;Decreased safety awareness;Decreased knowledge of use of DME or AE   OT Treatment/Interventions: Self-care/ADL training;DME and/or AE instruction;Patient/family education;Balance training;Cognitive remediation/compensation    OT Goals(Current goals can be found in the care plan section) Acute Rehab OT Goals Patient Stated Goal: none stated; agreeable to  OT OT Goal Formulation: With patient Time For Goal Achievement: 11/30/13 Potential to Achieve Goals: Fair ADL Goals Pt Will Perform Lower Body Bathing: with min assist;with adaptive equipment;sit to/from stand (following tdwb wtih min cues) Pt Will Perform Lower Body Dressing: with min assist;with adaptive equipment;sit to/from stand (following tdwb with min cues) Pt Will Transfer to Toilet: bedside commode;with min assist;stand pivot transfer (following tdwb with min cues) Pt Will Perform Toileting - Clothing Manipulation and hygiene: with min guard assist;sit to/from stand (following tdwb with min cues)  OT Frequency: Min 2X/week   Barriers to D/C:            Co-evaluation              End of Session    Activity Tolerance: Patient tolerated treatment well Patient left: in bed;with call bell/phone within reach;with bed alarm set   Time: 1136-1201 OT Time Calculation (min): 25 min Charges:  OT General Charges $OT Visit: 1 Procedure OT Evaluation $Initial OT Evaluation Tier I: 1 Procedure OT Treatments $Self Care/Home Management : 8-22 mins G-Codes:    Nieves Barberi 12-16-13, 12:32 PM Lesle Chris, OTR/L [phone] 12-16-13

## 2013-11-16 NOTE — Progress Notes (Signed)
  Radiation Oncology         [phone] ________________________________  Name: Sonia Weeks MRN: [id number]  Date: 11/16/2013  DOB: 22-Mar-1927  Simulation Verification Note    ICD-9-CM ICD-10-CM  1. Metastasis to brain 198.3 C79.31    Status: inpatient  NARRATIVE: The patient was brought to the treatment unit and placed in the planned treatment position. The clinical setup was verified. Then port films were obtained and uploaded to the radiation oncology medical record software.  The treatment beams were carefully compared against the planned radiation fields. The position location and shape of the radiation fields was reviewed. They targeted volume of tissue appears to be appropriately covered by the radiation beams. Organs at risk appear to be excluded as planned.  Based on my personal review, I approved the simulation verification. The patient's treatment will proceed as planned.  ------------------------------------------------  Sheral Apley [name], M.D.

## 2013-11-16 NOTE — Progress Notes (Signed)
CSW continuing to follow.   Pt and pt daughter agreed to [facility] for discharge plan in order for pt to continue radiation and get therapy before returning home.   Per MD, pt not yet medically ready for discharge today.   CSW visited pt room. Pt sleeping soundly at this time. Pt daughter not present at bedside.  CSW contacted pt daughter, Arbie Cookey via telephone to discuss that MD did not feel that pt medically ready for discharge at this time. Pt daughter expressed understanding. Pt daughter shared that she has been trying to reach Dr. Julien Nordmann nurse to inquire if Dr. Julien Nordmann is going to come see pt. CSW provided pt daughter phone number to [facility] and encouraged pt daughter to call and ask to speak to Dr. Worthy Flank nurse. Pt daughter appreciative.  CSW updated [facility].  CSW to continue to follow to provide support and assist with pt discharge planning needs to [facility].  Alison Murray, MSW, [facility] [phone]

## 2013-11-16 NOTE — Progress Notes (Signed)
2 Days Post-Op  Subjective: Pt is very tired and says she hurts all over.  Mentation seems clear this AM, but she is just exhausted right now.  Objective: Vital signs in last 24 hours: Temp:  [98.7 F (37.1 C)-99.4 F (37.4 C)] 98.7 F (37.1 C) (10/29 0554) Pulse Rate:  [76-92] 76 (10/29 0554) Resp:  [16] 16 (10/29 0554) BP: (172-185)/(80-94) 172/81 mmHg (10/29 0554) SpO2:  [95 %-99 %] 98 % (10/29 0554) Last BM Date: 11/13/13 Afebrile, BP up some No labs Path still pending Intake/Output from previous day: 10/28 0701 - 10/29 0700 In: 1355 [P.O.:240; I.V.:1115] Out: 200 [Urine:200] Intake/Output this shift:    General appearance: alert and very tired and complains of hurting all over. biopsy site, had a hematoma and the dressing/steri strips were no longer intact.  I cleaned the site with soap and water. Redressed with DSD.  Site looks fine..  Lab Results:   Recent Labs  11/15/13 0510  WBC 13.4*  HGB 11.3*  HCT 32.9*  PLT 252    BMET  Recent Labs  11/15/13 0510  NA 134*  K 4.0  CL 98  CO2 23  GLUCOSE 150*  BUN 15  CREATININE 0.78  CALCIUM 8.4   PT/INR No results found for this basename: LABPROT, INR,  in the last 72 hours   Recent Labs Lab 11/12/13 1114 11/13/13 0345 11/15/13 0510  AST 213* 185* 44*  ALT 136* 156* 73*  ALKPHOS 294* 301* 207*  BILITOT 1.0 1.8* 0.6  PROT 8.4* 7.0 7.3  ALBUMIN 3.1* 2.4* 2.5*     Lipase     Component Value Date/Time   LIPASE 18 10/16/2013 1320     Studies/Results: US Abdomen Limited Ruq  11/15/2013   CLINICAL DATA:  Right upper quadrant pain, history of metastatic disease of unknown primary.  EXAM: US ABDOMEN LIMITED - RIGHT UPPER QUADRANT  COMPARISON:  10/16/2013  FINDINGS: Gallbladder:  No definitive gallstones are identified. Echogenic areas are noted within the gallbladder wall with ring down artifact consistent with adenomyomatosis.  Common bile duct:  Diameter: 8.5 mm. This was prominent on the prior CT  examination as well.  Liver:  Heterogeneity is identified. There are multiple slightly hyperechoic lesions identified within the right lobe of the liver diffuse smaller lesions noted in the left lobe of the liver. These are consistent with metastatic disease appear to have increased slightly in the interval from the prior exam. The largest of these lesions measures 2.4 cm in greatest dimension.  Note is made of a subcutaneous hypoechoic nodule which corresponds to a lesion seen on prior CT examination. It is increased in size to 12 mm from 9 mm.  IMPRESSION: Increasing hepatic metastatic disease.  Increasing size in a subcutaneous nodule consistent with metastatic disease.  Adenomyomatosis within the gallbladder.  Prominent common bile duct stable from the prior CT.   Electronically Signed   By: Inez Catalina M.D.   On: 11/15/2013 11:23    Medications: . Chlorhexidine Gluconate Cloth  6 each Topical Daily  . dexamethasone  4 mg Oral 4 times per day  . docusate sodium  100 mg Oral BID  . enoxaparin (LOVENOX) injection  40 mg Subcutaneous Q24H  . feeding supplement (ENSURE COMPLETE)  237 mL Oral BID BM  . Influenza vac split quadrivalent PF  0.5 mL Intramuscular Tomorrow-1000  . insulin aspart  0-15 Units Subcutaneous TID WC  . insulin aspart  0-5 Units Subcutaneous QHS  . megestrol  40 mg  Oral Daily  . mupirocin ointment  1 application Nasal BID  . nicotine  14 mg Transdermal Daily  . pantoprazole  40 mg Oral Q0600  . pneumococcal 23 valent vaccine  0.5 mL Intramuscular Tomorrow-1000  . verapamil  180 mg Oral QHS    Assessment/Plan 1. Metastatic cancer of uncertain etiology, including; left and right lung, bilateral adrenal lesions, right hepatic lobe, left kidney, abdomen,  Nondisplaced Fracture of the greater trochanter of the right hip and brain metastasis.  S/p Excision of 1.5 cm right groin subcutaneous nodule,Todd Rosenbower, MD,11/14/2013. 2. Hx of Parotitis  3. Hypertension  5. AODM   6. Legally blind  7. Hx of tobacco use  8. Arthritis   Plan:  Keep the site clean and dry, she is wearing Depends garment, so I would keep a DSD over the site for now.  Path is pending.. Call if we can assist.       LOS: 4 days    Celia Friedland 11/16/2013

## 2013-11-16 NOTE — Consult Note (Signed)
I have reviewed and discussed case with Nurse Practitioner And agree with documentation and plan as noted above   Rocklyn Mayberry J. Aalina Brege D.O.  Palliative Medicine Team at [facility]  Team Phone: [phone]    

## 2013-11-16 NOTE — Progress Notes (Signed)
[facility] PROGRESS NOTE  Sonia Weeks ZOX:[id number] DOB: 1927-04-17 DOA: 11/12/2013 PCP: Ricke Hey, MD  Assessment/Plan: Nondisplaced Fracture of the greater trochanter of the right hip  -Secondary to mechanical fall and possibly pathological given recently diagnosed metastatic cancer.  -Supportive care with pain control. Added bowel regimen. Dr Tonita Cong and orthopedics were consulted by ED and reportedly recommended medical management and PT as tolerated.  -PT/OT eval and DVT prophylaxis  Metastatic cancer of unknown primary  -Patient following with Dr. Julien Nordmann as outpatient. -MRI of the brain done showing innumerable widespread intracranial metastatic deposits without any vasogenic edema or midline shift. Also shows acute bilateral maxillary sinus and ethmoid disease along with cervical lymphadenopathy is also subcentimeter foci of acute hemorrhage associated with some of the lesion.  -Per Dr Lona Kettle who is covering Dr. Earlie Server, recommended radiation oncology consult. Dr [name] recommended starting on decadron 4mg  q 6 hr.  -Pt is now s/p biopsy of R groin nodule 10/27 - follow results -Pt planned for radiation tx 10/29 generalised weakness with failure to thrive and moderate malnutrition  -Likely secondary to recently diagnosed metastatic cancer. Patient has poor by mouth intake with significant weight loss. Nutrition consult appreciated  -Added Ensure twice a day and Megace  Type 2 diabetes mellitus  Monitor on sliding scale insulin  Hypertension  Continue home therapy unable  Transaminitis  Likely secondary to metastases to the liver. monitor for now  Tobacco use  Counseled on suggestion. Continue nicotine patch  Code Status: DNR Family Communication: Pt and daughter at bedside Disposition Plan: Plan SNF when stable   Consultants:  Palliative Care  General Surgery  Radiation Oncology  Procedures:  R groin nodule biopsy  10/27  Antibiotics:    HPI/Subjective: No acute events noted overnight. Reports some difficulty swallowing food  Objective: Filed Vitals:   11/15/13 1549 11/15/13 1934 11/15/13 2124 11/16/13 0554  BP: 181/82  180/80 172/81  Pulse: 92  84 76  Temp: 99.4 F (37.4 C) 98.9 F (37.2 C) 98.9 F (37.2 C) 98.7 F (37.1 C)  TempSrc: Oral Oral Oral Oral  Resp: 16  16 16   Height:      Weight:      SpO2: 95%  97% 98%    Intake/Output Summary (Last 24 hours) at 11/16/13 1257 Last data filed at 11/16/13 0542  Gross per 24 hour  Intake   1355 ml  Output    200 ml  Net   1155 ml   Filed Weights   11/12/13 1807  Weight: 57.1 kg (125 lb 14.1 oz)    Exam:   General:  Awake, in nad  Cardiovascular: regular, s1, s2  Respiratory: normal resp effort, no wheezing  Abdomen: soft,nondistended  Musculoskeletal: perfused, no clubbing   Data Reviewed: Basic Metabolic Panel:  Recent Labs Lab 11/12/13 1114 11/12/13 1810 11/13/13 0345 11/15/13 0510  NA 138  --  135* 134*  K 4.2  --  3.9 4.0  CL 98  --  98 98  CO2 27  --  24 23  GLUCOSE 116*  --  101* 150*  BUN 15  --  12 15  CREATININE 0.86 0.84 0.78 0.78  CALCIUM 9.4  --  8.7 8.4   Liver Function Tests:  Recent Labs Lab 11/12/13 1114 11/13/13 0345 11/15/13 0510  AST 213* 185* 44*  ALT 136* 156* 73*  ALKPHOS 294* 301* 207*  BILITOT 1.0 1.8* 0.6  PROT 8.4* 7.0 7.3  ALBUMIN 3.1* 2.4* 2.5*   No results  found for this basename: LIPASE, AMYLASE,  in the last 168 hours No results found for this basename: AMMONIA,  in the last 168 hours CBC:  Recent Labs Lab 11/12/13 1114 11/12/13 1810 11/13/13 0345 2013/12/06 0510  WBC 9.2 8.1 7.7 13.4*  NEUTROABS 6.4  --   --   --   HGB 12.7 11.3* 11.1* 11.3*  HCT 37.0 33.7* 32.9* 32.9*  MCV 92.5 93.9 92.7 90.4  PLT 268 209 243 252   Cardiac Enzymes:  Recent Labs Lab 11/12/13 1114  TROPONINI <0.30   BNP (last 3 results)  Recent Labs  10/16/13 1320  PROBNP  1161.0*   CBG:  Recent Labs Lab 2013/12/06 0743 December 06, 2013 1237 06-Dec-2013 1716 12/06/2013 2123 11/16/13 0754  GLUCAP 139* 147* 141* 133* 138*    Recent Results (from the past 240 hour(s))  URINE CULTURE     Status: None   Collection Time    11/12/13 12:01 PM      Result Value Ref Range Status   Specimen Description URINE, CATHETERIZED   Final   Special Requests NONE   Final   Culture  Setup Time     Final   Value: 11/12/2013 23:05     Performed at [facility]     Final   Value: NO GROWTH     Performed at [facility]   Culture     Final   Value: NO GROWTH     Performed at [facility]   Report Status 11/14/2013 FINAL   Final  SURGICAL PCR SCREEN     Status: Abnormal   Collection Time    11/14/13  5:03 AM      Result Value Ref Range Status   MRSA, PCR NEGATIVE  NEGATIVE Final   Staphylococcus aureus POSITIVE (*) NEGATIVE Final   Comment:            The Xpert SA Assay (FDA     approved for NASAL specimens     in patients over 57 years of age),     is one component of     a comprehensive surveillance     program.  Test performance has     been validated by [employer] for patients greater     than or equal to 51 year old.     It is not intended     to diagnose infection nor to     guide or monitor treatment.     Studies: US Abdomen Limited Ruq  2013-12-06   CLINICAL DATA:  Right upper quadrant pain, history of metastatic disease of unknown primary.  EXAM: US ABDOMEN LIMITED - RIGHT UPPER QUADRANT  COMPARISON:  10/16/2013  FINDINGS: Gallbladder:  No definitive gallstones are identified. Echogenic areas are noted within the gallbladder wall with ring down artifact consistent with adenomyomatosis.  Common bile duct:  Diameter: 8.5 mm. This was prominent on the prior CT examination as well.  Liver:  Heterogeneity is identified. There are multiple slightly hyperechoic lesions identified within the right lobe of the liver diffuse  smaller lesions noted in the left lobe of the liver. These are consistent with metastatic disease appear to have increased slightly in the interval from the prior exam. The largest of these lesions measures 2.4 cm in greatest dimension.  Note is made of a subcutaneous hypoechoic nodule which corresponds to a lesion seen on prior CT examination. It is increased in size to 12 mm from  9 mm.  IMPRESSION: Increasing hepatic metastatic disease.  Increasing size in a subcutaneous nodule consistent with metastatic disease.  Adenomyomatosis within the gallbladder.  Prominent common bile duct stable from the prior CT.   Electronically Signed   By: Inez Catalina M.D.   On: 11/15/2013 11:23    Scheduled Meds: . Chlorhexidine Gluconate Cloth  6 each Topical Daily  . dexamethasone  4 mg Oral 4 times per day  . docusate sodium  100 mg Oral BID  . enoxaparin (LOVENOX) injection  40 mg Subcutaneous Q24H  . feeding supplement (ENSURE COMPLETE)  237 mL Oral BID BM  . insulin aspart  0-15 Units Subcutaneous TID WC  . insulin aspart  0-5 Units Subcutaneous QHS  . megestrol  40 mg Oral Daily  . mupirocin ointment  1 application Nasal BID  . nicotine  14 mg Transdermal Daily  . pantoprazole  40 mg Oral Q0600  . pneumococcal 23 valent vaccine  0.5 mL Intramuscular Tomorrow-1000  . verapamil  240 mg Oral QHS   Continuous Infusions: . sodium chloride 75 mL/hr at 11/16/13 1000    Principal Problem:   Fracture of greater trochanter of right femur Active Problems:   Hypertension   Metastatic cancer   Transaminitis   FTT (failure to thrive) in adult   Weakness generalized   Diabetes mellitus   Arthritis   Hip fracture, right   Malnutrition of moderate degree   Metastasis to brain   Palliative care encounter   DNR (do not resuscitate) discussion   Pain, cancer   Time spent: 39min   [facility], [facility] Pager [phone]. If 7PM-7AM, please contact night-coverage at [email or web address], password  [facility] 11/16/2013, 12:57 PM  LOS: 4 days

## 2013-11-16 NOTE — Telephone Encounter (Signed)
I was notified from lobby staff that pt family member asking if Dr Julien Nordmann is seeing pt. I requested that Wilma instruct family to call me and number given.

## 2013-11-16 NOTE — Progress Notes (Signed)
Physical Therapy Treatment Patient Details Name: Sonia Weeks MRN: [id number] DOB: 01-21-1927 Today's Date: 11/16/2013    History of Present Illness 80 yo female admitted with R greater trochanter fx after sustaining fall at home. Hx of HTN, DM, met cancer with primary unknown. MRI 10/26 + for brain mets.     PT Comments    Pt assisted OOB to/from [facility].  Pt continues to have difficulty maintaining TDWB so did not ambulate today.  Pt was able to tolerate active assist ROM exercises in bed.  Patient not assisted to recliner due to no chair alarm.  Follow Up Recommendations  SNF     Equipment Recommendations  None recommended by PT    Recommendations for Other Services       Precautions / Restrictions Precautions Precautions: Fall Restrictions Weight Bearing Restrictions: Yes RLE Weight Bearing: Touchdown weight bearing    Mobility  Bed Mobility Overal bed mobility: Needs Assistance Bed Mobility: Supine to Sit;Sit to Supine     Supine to sit: Min assist Sit to supine: Min assist   General bed mobility comments: assist for R LE  Transfers Overall transfer level: Needs assistance Equipment used: Rolling walker (2 wheeled) Transfers: Sit to/from [employer] Transfers Sit to Stand: +2 safety/equipment;Min assist Stand pivot transfers: +2 safety/equipment;Min assist       General transfer comment: verbal cues for hand placement, attempted to have pt lift foot from floor for more NWB since difficulty with TDWB with OT earlier however pt unable with stand pivot bed to [facility], pt assisted with a couple steps up toward [facility] with therapist foot under pt's foot to cue for TDWB which appeared better however pt still  performing more then TDWB at times  Ambulation/Gait Ambulation/Gait assistance:  (deferred due to difficulty maintaining TDWB)               Stairs            Wheelchair Mobility    Modified Rankin (Stroke Patients Only)       Balance                                     Cognition Arousal/Alertness: Awake/alert Behavior During Therapy: WFL for tasks assessed/performed Overall Cognitive Status: No family/caregiver present to determine baseline cognitive functioning Area of Impairment: Following commands;Safety/judgement     Memory: Decreased recall of precautions;Decreased short-term memory Following Commands: Follows one step commands inconsistently Safety/Judgement: Decreased awareness of safety   Problem Solving: Difficulty sequencing;Requires verbal cues General Comments: pt able to report limited WBing status but difficulty performing TDWB, at times appeared confused such as reporting meeting with "another company" later this afternoon    Exercises Total Joint Exercises Ankle Circles/Pumps: AROM;10 reps Heel Slides: AAROM;10 reps;Right Hip ABduction/ADduction: AAROM;Right;10 reps    General Comments        Pertinent Vitals/Pain Pain Assessment: Faces Pain Score: 0-No pain Faces Pain Scale: Hurts a little bit Pain Location: R hip Pain Descriptors / Indicators: Operative site guarding Pain Intervention(s): Repositioned;Monitored during session;Limited activity within patient's tolerance (declined meds and ice)    Home Living Family/patient expects to be discharged to:: Skilled nursing facility                    Prior Function Level of Independence: Independent with assistive device(s)          PT Goals (current goals can now be found  in the care plan section) Acute Rehab PT Goals Patient Stated Goal: none stated; agreeable to OT Progress towards PT goals: Progressing toward goals    Frequency  Min 3X/week    PT Plan Current plan remains appropriate    Co-evaluation             End of Session Equipment Utilized During Treatment: Gait belt Activity Tolerance: Patient tolerated treatment well Patient left: in bed;with call bell/phone within reach;with bed alarm set      Time: 1342-1405 PT Time Calculation (min): 23 min  Charges:  $Therapeutic Activity: 23-37 mins                    G Codes:      Imonie Tuch,KATHrine E November 26, 2013, 3:21 PM Carmelia Bake, PT, DPT 11/26/2013 Pager: [phone]

## 2013-11-16 NOTE — Progress Notes (Addendum)
Speech Language Pathology Treatment: Dysphagia  Patient Details Name: Sonia Weeks MRN: [id number] DOB: May 10, 1927 Today's Date: 11/16/2013 Time: 1510-1540 SLP Time Calculation (min): 30 min  Assessment / Plan / Recommendation Clinical Impression  Pt today reporting dysphagia to solids more than liquids- most notably small pieces.  Voice remains hoarse concerning for vagal nerve involvement.  Pt relays dysphagia x2 weeks only - and voice changes x3 months that she states has progressed.   Observed pt consuming thin water,thin apple juice via tsp/cup/straw.  Delayed and immediate cough noted after thin via cup/straw after which pt stated she "got too much".  Suspect possible decreased laryngeal closure allowing aspiration of thin.  Pt tolerated thin liquid via tsp and cups sips of nectar without overt indication of aspiration.  As pt is keenly aware of her solid food dysphagia and complained of abdomen pain/constipation - did not provide solid food.  SLP advised pt to order casserole style - cohesive foods and avoid particulate matter.     Will order thickener to put in pt drinks pending MBS next date.  Pt agreeable to plan for nectar thick liquid, thin via tsp, cohesive foods with strict precautions.     Concern is present for probable/possible progressive nature of dysphagia given pt's metastatic cancer diagnosis and possible mechanical obstructive dysphagia from parotid region growth.  MBS indicated to allow instrumental swallow evaluation due to pt's pharyngeal dysphagia complaints.       HPI HPI: Sonia Weeks is a 80 y.o. female who initially presented complaining of increasing fatigue and weakness- s/p fall with HPI: this is a very pleasant woman who fell and was found to have a nondisplaced right greater trochanter fracture.  Chest x-ray on 10/16/2013 showed an abnormal masslike density in the left perihilar region.  She has had large parotid glands (for 3 years per daughter and pt) and was  seen for this December 2012, by Dr. Janace Hoard with a diagnosis of parotitis and no further information in Epic about this since 01/17/11 per surgery service note. Pt has been found to have a metastatic Ca of uncertain etiology per MD note, imaging studies.  MRI showed "Innumerable widespread intracranial metastatic deposits as described - Posterior fossa lesions predominate, with potential for obstructive hydrocephalus should the RIGHT cerebellar or LEFT paramedian pontine lesions significant enlarge. Subcentimeter foci of presumed acute hemorrhage are associated with a minority of lesions. Incompletely evaluated bulky cervical adenopathy."  Swallow evaluation ordered.     Pertinent Vitals Pain Assessment: No/denies pain Faces Pain Scale: Hurts a little bit Pain Location: R hip Pain Descriptors / Indicators: Operative site guarding Pain Intervention(s): Repositioned;Monitored during session;Limited activity within patient's tolerance (declined meds and ice)  SLP Plan  MBS (next date)    Recommendations Diet recommendations: Nectar-thick liquid (thin via tsp) Liquids provided via: Cup Medication Administration: Whole meds with liquid (one at a time) Supervision: Intermittent supervision to cue for compensatory strategies;Patient able to self feed Compensations: Slow rate;Small sips/bites;Check for pocketing Postural Changes and/or Swallow Maneuvers: Seated upright 90 degrees;Upright 30-60 min after meal              Oral Care Recommendations: Oral care BID Follow up Recommendations:  (TBD) Plan: MBS (next date)    [place], [place], [facility] SLP [phone]

## 2013-11-17 ENCOUNTER — Ambulatory Visit
Admit: 2013-11-17 | Discharge: 2013-11-17 | Disposition: A | Discharge status: Home / Self Care | Payer: Medicare Other | Attending: Radiation Oncology | Admitting: Radiation Oncology

## 2013-11-17 ENCOUNTER — Encounter: Payer: Self-pay | Admitting: Radiation Oncology

## 2013-11-17 ENCOUNTER — Inpatient Hospital Stay (HOSPITAL_COMMUNITY): Payer: Medicare Other

## 2013-11-17 ENCOUNTER — Inpatient Hospital Stay
Admit: 2013-11-17 | Discharge: 2013-11-17 | Disposition: A | Discharge status: Home / Self Care | Payer: Medicare Other | Attending: Radiation Oncology | Admitting: Radiation Oncology

## 2013-11-17 VITALS — BP 159/74 | HR 84 | Temp 98.1°F | Resp 16

## 2013-11-17 DIAGNOSIS — C7931 Secondary malignant neoplasm of brain: Secondary | ICD-10-CM

## 2013-11-17 DIAGNOSIS — C799 Secondary malignant neoplasm of unspecified site: Secondary | ICD-10-CM

## 2013-11-17 LAB — GLUCOSE, CAPILLARY
GLUCOSE-CAPILLARY: 113 mg/dL — AB (ref 70–99)
Glucose-Capillary: 124 mg/dL — ABNORMAL HIGH (ref 70–99)
Glucose-Capillary: 137 mg/dL — ABNORMAL HIGH (ref 70–99)

## 2013-11-17 LAB — CBC
HCT: 30.5 % — ABNORMAL LOW (ref 36.0–46.0)
Hemoglobin: 10.7 g/dL — ABNORMAL LOW (ref 12.0–15.0)
MCH: 31.8 pg (ref 26.0–34.0)
MCHC: 35.1 g/dL (ref 30.0–36.0)
MCV: 90.5 fL (ref 78.0–100.0)
Platelets: 252 10*3/uL (ref 150–400)
RBC: 3.37 MIL/uL — AB (ref 3.87–5.11)
RDW: 12.1 % (ref 11.5–15.5)
WBC: 11.5 10*3/uL — ABNORMAL HIGH (ref 4.0–10.5)

## 2013-11-17 LAB — BASIC METABOLIC PANEL
Anion gap: 12 (ref 5–15)
BUN: 25 mg/dL — AB (ref 6–23)
CHLORIDE: 100 meq/L (ref 96–112)
CO2: 23 mEq/L (ref 19–32)
Calcium: 8 mg/dL — ABNORMAL LOW (ref 8.4–10.5)
Creatinine, Ser: 0.79 mg/dL (ref 0.50–1.10)
GFR calc non Af Amer: 74 mL/min — ABNORMAL LOW (ref >90)
GFR, EST AFRICAN AMERICAN: 86 mL/min — AB (ref >90)
Glucose, Bld: 125 mg/dL — ABNORMAL HIGH (ref 70–99)
Potassium: 3.5 mEq/L — ABNORMAL LOW (ref 3.7–5.3)
Sodium: 135 mEq/L — ABNORMAL LOW (ref 137–147)

## 2013-11-17 MED ORDER — DEXAMETHASONE 4 MG PO TABS: 4.0000 mg | ORAL_TABLET | Freq: Four times a day (QID) | ORAL | Status: AC

## 2013-11-17 MED ORDER — DSS 100 MG PO CAPS: 100.0000 mg | ORAL_CAPSULE | Freq: Two times a day (BID) | ORAL | Status: DC

## 2013-11-17 MED ORDER — BISACODYL 10 MG RE SUPP: 10.0000 mg | Freq: Every day | RECTAL | Status: DC | PRN

## 2013-11-17 MED ORDER — IBUPROFEN 400 MG PO TABS: 400.0000 mg | ORAL_TABLET | ORAL | Status: DC | PRN

## 2013-11-17 MED ORDER — HYDROCODONE-ACETAMINOPHEN 5-325 MG PO TABS
1.0000 | ORAL_TABLET | Freq: Four times a day (QID) | ORAL | Status: DC | PRN
Start: 2013-11-17 — End: 2013-11-17

## 2013-11-17 MED ORDER — METHOCARBAMOL 500 MG PO TABS: 500.0000 mg | ORAL_TABLET | Freq: Four times a day (QID) | ORAL | Status: DC | PRN

## 2013-11-17 MED ORDER — OXYCODONE-ACETAMINOPHEN 5-325 MG PO TABS: 2.0000 | ORAL_TABLET | ORAL | Status: DC | PRN

## 2013-11-17 MED ORDER — RESOURCE THICKENUP CLEAR PO POWD
ORAL | Status: DC | PRN
  Filled 2013-11-17: qty 125

## 2013-11-17 NOTE — Progress Notes (Signed)
Physical Therapy Treatment Patient Details Name: Sonia Weeks MRN: [id number] DOB: 01-12-1928 Today's Date: 11/17/2013    History of Present Illness      PT Comments    This tx included bed mobility and transfers to a WC via standing pivot.  Patient is improving with level of A required for bed mobility compared to last session.  Patient still unable to recall or maintain TDWB status.  She became slightly agitated with manual contact by PTA in attempt to monitor and control WB status.  Follow Up Recommendations  SNF     Equipment Recommendations  None recommended by PT    Recommendations for Other Services OT consult     Precautions / Restrictions Precautions Precautions: Fall Restrictions Weight Bearing Restrictions: Yes RLE Weight Bearing: Touchdown weight bearing LLE Weight Bearing: Weight bearing as tolerated    Mobility  Bed Mobility Overal bed mobility: Modified Independent Bed Mobility: Supine to Sit     Supine to sit: Modified independent (Device/Increase time)     General bed mobility comments: Patient did not require manual A; HOB elevated and use of side rails; pt requires increased time to perform.  Transfers Overall transfer level: Needs assistance   Transfers: Sit to/from Stand Sit to Stand: Min guard Stand pivot transfers: Min assist       General transfer comment: VCs for hand placement and sequencing; Pt was transferred to [facility] for transport; patient requires increased time to perform; does not maintain TDWB; patient is slightly impulsive.   Ambulation/Gait                 Stairs            Wheelchair Mobility    Modified Rankin (Stroke Patients Only)       Balance                                    Cognition Arousal/Alertness: Awake/alert Behavior During Therapy: WFL for tasks assessed/performed   Area of Impairment: Safety/judgement     Memory: Decreased recall of precautions    Safety/Judgement: Decreased awareness of safety   Problem Solving: Difficulty sequencing;Requires verbal cues General Comments: Patient has difficulty recalling and maintaining WB status; patient became slightly agitated with attempts to [facility] monitor WB status with PTA hands, "don't touch me"    Exercises      General Comments        Pertinent Vitals/Pain Pain Assessment: Faces Pain Score: 3  Faces Pain Scale: Hurts a little bit Pain Location: R hip Pain Intervention(s): Monitored during session    Home Living                      Prior Function            PT Goals (current goals can now be found in the care plan section) Progress towards PT goals: Progressing toward goals    Frequency  Min 3X/week    PT Plan Current plan remains appropriate    Co-evaluation             End of Session Equipment Utilized During Treatment: Gait belt Activity Tolerance: Patient tolerated treatment well Patient left: Other (comment);with nursing/sitter in room (left in [facility] for transport to swallow study with a tech.)     Time: 3428-7681 PT Time Calculation (min): 8 min  Charges:  $Therapeutic Activity: 8-22 mins  G Codes:      Miller,Derrick, SPTA 11/17/2013, 2:06 PM  reviewd above  Rica Koyanagi  PTA [facility]  Acute  Rehab Pager      [phone]

## 2013-11-17 NOTE — Progress Notes (Signed)
[facility] Therapy Treatment Record Phone [phone]   Radiation Therapy was administered to Sonia Weeks on: 11/17/2013  11:22 AM and was treatment # 2 out of a planned course of 10 treatments.

## 2013-11-17 NOTE — Plan of Care (Signed)
Problem: Phase III Progression Outcomes Goal: Pain controlled on oral analgesia Outcome: Progressing Pain controlled with percocet prn

## 2013-11-17 NOTE — Clinical Social Work Note (Signed)
Plan is for dc to [facility] SNF once medically stable. Per report this morning, she is to have MBS and radiation treatment today and then possible dc later today-  Eduard Clos, MSW, Latanya Presser

## 2013-11-17 NOTE — Progress Notes (Signed)
Reports right hip pain 5 on a scale of 0-10. Reports pain is less since taking percocet. Reports diminished vision but, denies diplopia. Denies ringing in the ears. Denies headache or dizziness.

## 2013-11-17 NOTE — Progress Notes (Signed)
[facility] PROGRESS NOTE  Sonia Weeks WCH:[id number] DOB: 1927-09-11 DOA: 11/12/2013 PCP: Ricke Hey, MD  Assessment/Plan: Nondisplaced Fracture of the greater trochanter of the right hip  -Secondary to mechanical fall and possibly pathological given recently diagnosed metastatic cancer.  -Supportive care with pain control. Added bowel regimen. Dr Tonita Cong and orthopedics were consulted by ED and reportedly recommended medical management and PT as tolerated.  -PT/OT eval and DVT prophylaxis - for SNFplacement Metastatic cancer of unknown primary  -Patient following with Dr. Julien Nordmann as outpatient. -MRI of the brain done showing innumerable widespread intracranial metastatic deposits without any vasogenic edema or midline shift. Also shows acute bilateral maxillary sinus and ethmoid disease along with cervical lymphadenopathy is also subcentimeter foci of acute hemorrhage associated with some of the lesion.  -Per Dr Lona Kettle who is covering Dr. Earlie Server, recommended radiation oncology consult. Dr [name] recommended starting on decadron 4mg  q 6 hr.  -Pt is now s/p biopsy of R groin nodule 10/27 - follow results - pending as of 10/30 -Pt started radiation tx 10/29 generalised weakness with failure to thrive and moderate malnutrition  -Likely secondary to recently diagnosed metastatic cancer. -Patient has poor by mouth intake with significant weight loss.  -Nutrition consult appreciated  -Added Ensure twice a day and Megace  Type 2 diabetes mellitus  -Monitor on sliding scale insulin  Hypertension  -Continue home therapy unable  -Poorly controlled with high dose steroids -increased verapamil to 240mg  Transaminitis  -Likely secondary to metastases to the liver as evidenced by RUQ [place]. monitor for now  Tobacco use  Counseled on suggestion. Continue nicotine patch Dysphagia -MBS per SLP -Follow up SLP recs  Code Status: DNR Family Communication: Pt and daughter at  bedside Disposition Plan: Plan SNF when stable  Consultants:  Palliative Care  General Surgery  Radiation Oncology  Procedures:  R groin nodule biopsy 10/27  Antibiotics:    HPI/Subjective: Complains of difficulty swallowing and feeling sob as a result.  Objective: Filed Vitals:   11/16/13 2235 11/17/13 0025 11/17/13 0404 11/17/13 0425  BP:   159/74   Pulse: 76  84   Temp:   98.1 F (36.7 C)   TempSrc:   Oral   Resp:  18 16 16   Height:      Weight:      SpO2:  95% 96%     Intake/Output Summary (Last 24 hours) at 11/17/13 1032 Last data filed at 11/17/13 0615  Gross per 24 hour  Intake 2337.5 ml  Output    600 ml  Net 1737.5 ml   Filed Weights   11/12/13 1807  Weight: 57.1 kg (125 lb 14.1 oz)    Exam:   General:  Awake, in nad  Cardiovascular: regular, s1, s2  Respiratory: normal resp effort, no wheezing  Abdomen: soft,nondistended  Musculoskeletal: perfused, no clubbing   Data Reviewed: Basic Metabolic Panel:  Recent Labs Lab 11/12/13 1114 11/12/13 1810 11/13/13 0345 11/15/13 0510 11/17/13 0403  NA 138  --  135* 134* 135*  K 4.2  --  3.9 4.0 3.5*  CL 98  --  98 98 100  CO2 27  --  24 23 23   GLUCOSE 116*  --  101* 150* 125*  BUN 15  --  12 15 25*  CREATININE 0.86 0.84 0.78 0.78 0.79  CALCIUM 9.4  --  8.7 8.4 8.0*   Liver Function Tests:  Recent Labs Lab 11/12/13 1114 11/13/13 0345 11/15/13 0510  AST 213* 185* 44*  ALT 136*  156* 73*  ALKPHOS 294* 301* 207*  BILITOT 1.0 1.8* 0.6  PROT 8.4* 7.0 7.3  ALBUMIN 3.1* 2.4* 2.5*   No results found for this basename: LIPASE, AMYLASE,  in the last 168 hours No results found for this basename: AMMONIA,  in the last 168 hours CBC:  Recent Labs Lab 11/12/13 1114 11/12/13 1810 11/13/13 0345 December 12, 2013 0510 11/17/13 0403  WBC 9.2 8.1 7.7 13.4* 11.5*  NEUTROABS 6.4  --   --   --   --   HGB 12.7 11.3* 11.1* 11.3* 10.7*  HCT 37.0 33.7* 32.9* 32.9* 30.5*  MCV 92.5 93.9 92.7 90.4  90.5  PLT 268 209 243 252 252   Cardiac Enzymes:  Recent Labs Lab 11/12/13 1114  TROPONINI <0.30   BNP (last 3 results)  Recent Labs  10/16/13 1320  PROBNP 1161.0*   CBG:  Recent Labs Lab 11/16/13 0754 11/16/13 1309 11/16/13 1649 11/16/13 2114 11/17/13 0814  GLUCAP 138* 191* 119* 123* 124*    Recent Results (from the past 240 hour(s))  URINE CULTURE     Status: None   Collection Time    11/12/13 12:01 PM      Result Value Ref Range Status   Specimen Description URINE, CATHETERIZED   Final   Special Requests NONE   Final   Culture  Setup Time     Final   Value: 11/12/2013 23:05     Performed at [facility]     Final   Value: NO GROWTH     Performed at [facility]   Culture     Final   Value: NO GROWTH     Performed at [facility]   Report Status 11/14/2013 FINAL   Final  SURGICAL PCR SCREEN     Status: Abnormal   Collection Time    11/14/13  5:03 AM      Result Value Ref Range Status   MRSA, PCR NEGATIVE  NEGATIVE Final   Staphylococcus aureus POSITIVE (*) NEGATIVE Final   Comment:            The Xpert SA Assay (FDA     approved for NASAL specimens     in patients over 48 years of age),     is one component of     a comprehensive surveillance     program.  Test performance has     been validated by [employer] for patients greater     than or equal to 56 year old.     It is not intended     to diagnose infection nor to     guide or monitor treatment.     Studies: US Abdomen Limited Ruq  12/12/13   CLINICAL DATA:  Right upper quadrant pain, history of metastatic disease of unknown primary.  EXAM: US ABDOMEN LIMITED - RIGHT UPPER QUADRANT  COMPARISON:  10/16/2013  FINDINGS: Gallbladder:  No definitive gallstones are identified. Echogenic areas are noted within the gallbladder wall with ring down artifact consistent with adenomyomatosis.  Common bile duct:  Diameter: 8.5 mm. This was prominent on the  prior CT examination as well.  Liver:  Heterogeneity is identified. There are multiple slightly hyperechoic lesions identified within the right lobe of the liver diffuse smaller lesions noted in the left lobe of the liver. These are consistent with metastatic disease appear to have increased slightly in the interval from the prior exam. The largest of  these lesions measures 2.4 cm in greatest dimension.  Note is made of a subcutaneous hypoechoic nodule which corresponds to a lesion seen on prior CT examination. It is increased in size to 12 mm from 9 mm.  IMPRESSION: Increasing hepatic metastatic disease.  Increasing size in a subcutaneous nodule consistent with metastatic disease.  Adenomyomatosis within the gallbladder.  Prominent common bile duct stable from the prior CT.   Electronically Signed   By: Inez Catalina M.D.   On: 11/15/2013 11:23    Scheduled Meds: . Chlorhexidine Gluconate Cloth  6 each Topical Daily  . dexamethasone  4 mg Oral 4 times per day  . docusate sodium  100 mg Oral BID  . enoxaparin (LOVENOX) injection  40 mg Subcutaneous Q24H  . feeding supplement (ENSURE COMPLETE)  237 mL Oral BID BM  . insulin aspart  0-15 Units Subcutaneous TID WC  . insulin aspart  0-5 Units Subcutaneous QHS  . megestrol  40 mg Oral Daily  . mupirocin ointment  1 application Nasal BID  . nicotine  14 mg Transdermal Daily  . pantoprazole  40 mg Oral Q0600  . pneumococcal 23 valent vaccine  0.5 mL Intramuscular Tomorrow-1000  . verapamil  240 mg Oral QHS   Continuous Infusions: . sodium chloride 1,000 mL (11/16/13 2232)    Principal Problem:   Fracture of greater trochanter of right femur Active Problems:   Hypertension   Metastatic cancer   Transaminitis   FTT (failure to thrive) in adult   Weakness generalized   Diabetes mellitus   Arthritis   Hip fracture, right   Malnutrition of moderate degree   Metastasis to brain   Palliative care encounter   DNR (do not resuscitate)  discussion   Pain, cancer   Time spent: 74min   Angenette Daily, [facility] Pager [phone]. If 7PM-7AM, please contact night-coverage at [email or web address], password [facility] 11/17/2013, 10:32 AM  LOS: 5 days

## 2013-11-17 NOTE — Clinical Social Work Note (Signed)
Patient for d/c today to SNF bed at [facility].  Daughter, Arbie Cookey, and patient agreeable to this plan- plan transfer via EMS. Eduard Clos, MSW, Latanya Presser

## 2013-11-17 NOTE — Progress Notes (Signed)
Patient c/o constipation, soap sud enema given as ordered, with return flow of same solution with just a very small amount of stool.did not feel any fecal impaction.

## 2013-11-17 NOTE — Discharge Summary (Signed)
Physician Discharge Summary  Sonia Weeks AOZ:[id number] DOB: 1927-03-12 DOA: 11/12/2013  PCP: Ricke Hey, MD  Admit date: 11/12/2013 Discharge date: 11/17/2013  Time spent: 35 minutes  Recommendations for Outpatient Follow-up:  1. Follow up with PCP as needed 2. Would strongly consulting Hospice  Discharge Diagnoses:  Principal Problem:   Fracture of greater trochanter of right femur Active Problems:   Hypertension   Metastatic cancer   Transaminitis   FTT (failure to thrive) in adult   Weakness generalized   Diabetes mellitus   Arthritis   Hip fracture, right   Malnutrition of moderate degree   Metastasis to brain   Palliative care encounter   DNR (do not resuscitate) discussion   Pain, cancer   Discharge Condition:  Stable  Diet recommendation: Dysphagia 3 with THICK liquids  Filed Weights   11/12/13 1807  Weight: 57.1 kg (125 lb 14.1 oz)    History of present illness:  Please see admit h and p from 10/25 for details. Briefly, pt presents with fall, found to have widespread metastatic lesions. Pt was admitted for further work up.  Hospital Course:  Nondisplaced Fracture of the greater trochanter of the right hip  -Secondary to mechanical fall and possibly pathological given recently diagnosed metastatic cancer.  -Supportive care with pain control. Added bowel regimen. Dr Tonita Cong and orthopedics were consulted by ED and reportedly recommended medical management and PT as tolerated.  -PT/OT eval and DVT prophylaxis - for SNFplacement  Metastatic cancer of unknown primary  -Patient following with Dr. Julien Nordmann as outpatient.  -MRI of the brain done showing innumerable widespread intracranial metastatic deposits without any vasogenic edema or midline shift. Also shows acute bilateral maxillary sinus and ethmoid disease along with cervical lymphadenopathy is also subcentimeter foci of acute hemorrhage associated with some of the lesion.  -Per Dr Lona Kettle who is  covering Dr. Earlie Server, recommended radiation oncology consult. Dr [name] recommended starting on decadron 4mg  q 6 hr.  -Pt is now s/p biopsy of R groin nodule 10/27 - follow results - pending as of 10/30  -Pt started radiation tx 10/29  -8 more radiation treatments remain -Family states being told by Oncology that pt has "a couple weeks left" -Strongly recommend Hospice referral at facility generalised weakness with failure to thrive and moderate malnutrition  -Likely secondary to recently diagnosed metastatic cancer.  -Patient has poor by mouth intake with significant weight loss.  -Nutrition consult appreciated  -Added Ensure twice a day and Megace  Type 2 diabetes mellitus  -Monitor on sliding scale insulin  Hypertension  -Continue home therapy unable  -Poorly controlled with high dose steroids  -increased verapamil to 240mg   Transaminitis  -Likely secondary to metastases to the liver as evidenced by RUQ [place] Tobacco use  Counseled on suggestion. Continue nicotine patch  Dysphagia  -MBS per SLP  -SLP recs for Dysphagia 3 with thick liquids  Consultations:  Oncology  Palliative Care  Radiation Oncology  Discharge Exam: Filed Vitals:   11/17/13 0025 11/17/13 0404 11/17/13 0425 11/17/13 1525  BP:  159/74  196/93  Pulse:  84  85  Temp:  98.1 F (36.7 C)  98.4 F (36.9 C)  TempSrc:  Oral  Oral  Resp: 18 16 16 16   Height:      Weight:      SpO2: 95% 96%  99%    General: awake, in nad Cardiovascular: regular, s1, s2 Respiratory: normal resp effort, no wheezing  Discharge Instructions     Medication List  STOP taking these medications       hydrochlorothiazide 12.5 MG capsule  Commonly known as:  MICROZIDE     HYDROcodone-acetaminophen 5-325 MG per tablet  Commonly known as:  NORCO/VICODIN      TAKE these medications       acetaminophen 500 MG tablet  Commonly known as:  TYLENOL  Take 1,000 mg by mouth every 6 (six) hours as needed for moderate pain.      albuterol 108 (90 BASE) MCG/ACT inhaler  Commonly known as:  PROVENTIL HFA;VENTOLIN HFA  Inhale 2 puffs into the lungs every 6 (six) hours as needed for wheezing or shortness of breath.     bisacodyl 10 MG suppository  Commonly known as:  DULCOLAX  Place 1 suppository (10 mg total) rectally daily as needed for mild constipation.     dexamethasone 4 MG tablet  Commonly known as:  DECADRON  Take 1 tablet (4 mg total) by mouth every 6 (six) hours.     DSS 100 MG Caps  Take 100 mg by mouth 2 (two) times daily.     ibuprofen 400 MG tablet  Commonly known as:  ADVIL,MOTRIN  Take 1 tablet (400 mg total) by mouth every 4 (four) hours as needed for fever, headache or mild pain.     methocarbamol 500 MG tablet  Commonly known as:  ROBAXIN  Take 1 tablet (500 mg total) by mouth every 6 (six) hours as needed for muscle spasms.     naproxen 500 MG tablet  Commonly known as:  NAPROSYN  Take 500 mg by mouth 2 (two) times daily as needed for moderate pain.     oxyCODONE-acetaminophen 5-325 MG per tablet  Commonly known as:  PERCOCET/ROXICET  Take 2 tablets by mouth every 4 (four) hours as needed for moderate pain.     verapamil 180 MG (CO) 24 hr tablet  Commonly known as:  COVERA HS  Take 180 mg by mouth at bedtime.       No Known Allergies Follow-up Information   Follow up with Ricke Hey, MD. (As needed)    Specialty:  Family Medicine   Contact information:   [address] [place] [address] [phone]        The results of significant diagnostics from this hospitalization (including imaging, microbiology, ancillary and laboratory) are listed below for reference.    Significant Diagnostic Studies: Dg Hip Complete Right  11/12/2013   CLINICAL DATA:  Patient fell in her home this morning injuring right hip. Right hip pain.  EXAM: RIGHT HIP - COMPLETE 2+ VIEW  COMPARISON:  None.  FINDINGS: No fracture. Hip joint is normally aligned. Bones are diffusely  demineralized.  Pelvis is intact. SI joints and symphysis pubis are normally aligned.  Bones are diffusely demineralized.  Soft tissues are unremarkable.  IMPRESSION: No fracture or dislocation.   Electronically Signed   By: Lajean Manes M.D.   On: 11/12/2013 11:39   Ct Head Wo Contrast  11/12/2013   CLINICAL DATA:  Status post fall is more known with injury/laceration.  EXAM: CT HEAD WITHOUT CONTRAST  TECHNIQUE: Contiguous axial images were obtained from the base of the skull through the vertex without intravenous contrast.  COMPARISON:  CT neck 01/13/2011  FINDINGS: Ventricles are normal in size. Negative for intra or extra-axial hemorrhage, mass effect, mass lesion, or evidence of acute cortically based infarction. Mild chronic microvascular ischemic changes in the periventricular white matter bilaterally. Dural calcifications noted near the vertex.  Extensive sinus disease noted with frothy secretions and air-fluid levels in both maxillary sinuses and scattered fluid in the ethmoid air cells. Visualized mastoid air cells are clear. Inferior aspect of the right mastoid air cells not included on the images.  Skull is somewhat thickened. Negative for skull fracture. Mandibular condyles located.  Soft tissues of the scalp are symmetric and there is no evidence of scalp hematoma.  IMPRESSION: Acute appearing bilateral axillary and ethmoid sinusitis.  Negative for scalp hematoma or skull fracture.  No acute intracranial abnormality.   Electronically Signed   By: Curlene Dolphin M.D.   On: 11/12/2013 11:37   Mr Jeri Cos FG Contrast  11/13/2013   CLINICAL DATA:  Staging of widely metastatic disease of unknown primary, pending tissue diagnosis.  EXAM: MRI HEAD WITHOUT AND WITH CONTRAST  TECHNIQUE: Multiplanar, multiecho pulse sequences of the brain and surrounding structures were obtained without and with intravenous contrast.  CONTRAST:  6mL MULTIHANCE GADOBENATE DIMEGLUMINE 529 MG/ML IV SOLN  COMPARISON:  CT  head 11/12/2013.  FINDINGS: There are innumerable widespread intracranial metastatic deposits which are concentrated in the posterior fossa. The largest lesion involves the RIGHT cerebellar hemisphere, and is surrounded by mild vasogenic edema with slight mass effect on the fourth ventricle. It measures 21 x 22 x 20 mm (R-L x A-P x C-C). The largest brainstem lesion is seen in the LEFT paramedian pons measuring 15 x 13 x 13 mm. Extensive widespread supratentorial metastatic lesions are largely subcentimeter in size. The majority lesions show contrast enhancement with central necrosis although some are solid.  Subcentimeter foci of presumed acute hemorrhage (or melanin) is associated with only a small proportion of lesions, including the LEFT paramedian pontine lesion, the LEFT cerebellar peripheral hemispheric lesion, a LEFT frontal subependymal lesion near the caudate, as well as a LEFT posterior frontal cortical lesion.  Generalized atrophy. Chronic microvascular ischemic change in addition to T2 and FLAIR hyperintensity surrounding the multiple metastatic deposits. No tonsillar herniation. No pituitary lesion. No impending hydrocephalus. No midline shift. Flow voids are maintained. Negative orbits. Acute BILATERAL maxillary sinus and ethmoid disease. Slight heterogeneity of marrow signal without focal osseous lesion. No definite meningeal enhancement.  There is bulky cervical lymphadenopathy incompletely evaluated. As seen on coronal imaging, presumed lymphadenopathy is intimately involved with both the LEFT and RIGHT parotid glands. BILATERAL parotid masses are not excluded, but given the findings on chest and abdomen CT, cervical lymphadenopathy is favored.  IMPRESSION: Innumerable widespread intracranial metastatic deposits as described. Posterior fossa lesions predominate, with potential for obstructive hydrocephalus should the RIGHT cerebellar or LEFT paramedian pontine lesions significant enlarge.   Subcentimeter foci of presumed acute hemorrhage are associated with a minority of lesions.  Incompletely evaluated bulky cervical adenopathy.   Electronically Signed   By: Rolla Flatten M.D.   On: 11/13/2013 08:20   Ct Hip Right Wo Contrast  11/12/2013   CLINICAL DATA:  Fall, right hip pain  EXAM: CT OF THE RIGHT HIP WITHOUT CONTRAST  TECHNIQUE: Multidetector CT imaging of the right hip was performed according to the standard protocol. Multiplanar CT image reconstructions were also generated.  COMPARISON:  Right hip radiographs dated 11/12/2013  FINDINGS: Nondisplaced fracture involving the right greater trochanter (series 4/image 52).  Right femoral neck is intact.  Mild degenerative changes of the right hip.  Visualized pelvic soft tissues are notable for vascular calcifications but are otherwise within normal limits.  IMPRESSION: Nondisplaced fracture involving the right greater trochanter.   Electronically Signed  By: Julian Hy M.D.   On: 11/12/2013 15:30   Chest Portable 1 View  11/12/2013   CLINICAL DATA:  Smoker, preoperative evaluation for hip fracture. Personal history hypertension, diabetes, smoking, metastatic cancer  EXAM: PORTABLE CHEST - 1 VIEW  COMPARISON:  Portable exam 1759 hr compared to 10/16/2013  FINDINGS: Normal heart size, mediastinal contours and pulmonary vascularity.  Atherosclerotic calcification aortic arch.  LEFT perihilar mass again identified, poorly defined, appears slightly more prominent versus prior study though this may be related to AP versus PA technique.  Few poorly defined pulmonary nodules are questioned in the lower RIGHT lung.  Underlying emphysematous changes.  No pleural effusion or pneumothorax.  Bones demineralized.  IMPRESSION: Emphysematous changes with persistent RIGHT perihilar mass in question subtle nodular densities in the lower RIGHT lung.   Electronically Signed   By: Lavonia Dana M.D.   On: 11/12/2013 18:29   Dg Swallowing Func-speech  Pathology  11/17/2013   Orbie Pyo [place], CCC-SLP     11/17/2013  2:56 PM Objective Swallowing Evaluation: Modified Barium Swallowing Study   Patient Details  Name: Sonia Weeks MRN: [id number] Date of Birth: Jan 29, 1927  Today's Date: 11/17/2013 Time: 104-1405 SLP Time Calculation (min): 20 min  Past Medical History:  Past Medical History  Diagnosis Date  . Hypertension   . Diabetes mellitus   . Arthritis   . Metastatic cancer 10/26/2013   Past Surgical History:  Past Surgical History  Procedure Laterality Date  . Ectopic pregnancy surgery    . Groin dissection Right 11/14/2013    Procedure: REMOVAL OF RIGHT GROIN NODULE;  Surgeon: Jackolyn Confer, MD;  Location: [facility];  Service: General;   Laterality: Right;   HPI:  Ms. Philyaw is a 80 y.o. female who initially presented  complaining of increasing fatigue and weakness- s/p fall with  HPI: this is a very pleasant woman who fell and was found to have  a nondisplaced right greater trochanter fracture.  Chest x-ray on  10/16/2013 showed an abnormal masslike density in the left  perihilar region.  She has had large parotid glands (for 3 years  per daughter and pt) and was seen for this December 2012, by Dr.  Janace Hoard with a diagnosis of parotitis and no further information in  Epic about this since 01/17/11 per surgery service note. Pt has  been found to have a metastatic Ca of uncertain etiology per MD  note, imaging studies.  MRI showed "Innumerable widespread  intracranial metastatic deposits as described - Posterior fossa  lesions predominate, with potential for obstructive hydrocephalus  should the RIGHT cerebellar or LEFT paramedian pontine lesions  significant enlarge. Subcentimeter foci of presumed acute  hemorrhage are associated with a minority of lesions.  Incompletely evaluated bulky cervical adenopathy."  Swallow  evaluation ordered.       Assessment / Plan / Recommendation Clinical Impression  Dysphagia Diagnosis: Moderate pharyngeal phase dysphagia  Clinical impression: Pt. exhibited a moderate motor based  pharyngeal dysphagia due to decreased laryngeal elevation and  laryngeal closure resulting in penetration to vocal cords with  trace silent aspiration with thins.  Cup sip nectar entered  laryngeal vestibule with head in neutral position, however chin  tuck appeared to prevent penetration during this study.   Vallecular residue (mild-mod) caused by reduced tongue base  residue and decreased with second swallow.  Solid texture not  assessed and pt stated she "would be unable to chew" it.  SLP  recommends nectar thick liquids and Dys  3 diet texture with chin  tuck, small sips, double swallow, no straws and pills whole in  applesauce.  Swallow function may decline when/if metastatic  cancer progresses.  ST will see pt. while hospitalized.     Treatment Recommendation  Therapy as outlined in treatment plan below    Diet Recommendation Dysphagia 3 (Mechanical Soft);Nectar-thick  liquid   Liquid Administration via: Cup;No straw Medication Administration: Whole meds with puree Supervision: Patient able to self feed;Full supervision/cueing  for compensatory strategies Compensations: Slow rate;Small sips/bites;Multiple dry swallows  after each bite/sip Postural Changes and/or Swallow Maneuvers: Seated upright 90  degrees    Other  Recommendations Oral Care Recommendations: Oral care BID Other Recommendations: Order thickener from pharmacy   Follow Up Recommendations   (TBD)    Frequency and Duration min 2x/week  2 weeks   Pertinent Vitals/Pain No pain            Reason for Referral Objectively evaluate swallowing function   Oral Phase Oral Preparation/Oral Phase Oral Phase: WFL   Pharyngeal Phase Pharyngeal Phase Pharyngeal Phase: Impaired Pharyngeal - Nectar Pharyngeal - Nectar Teaspoon: Pharyngeal residue -  valleculae;Reduced laryngeal elevation (min residue) Pharyngeal - Nectar Cup: Penetration/Aspiration during  swallow;Reduced airway/laryngeal closure;Reduced  laryngeal  elevation;Pharyngeal residue - valleculae;Reduced tongue base  retraction Penetration/Aspiration details (nectar cup): Material enters  airway, remains ABOVE vocal cords then ejected out;Material  enters airway, remains ABOVE vocal cords and not ejected out Pharyngeal - Thin Pharyngeal - Thin Teaspoon: Reduced tongue base  retraction;Pharyngeal residue - valleculae (with chin tuck) Pharyngeal - Thin Cup: Penetration/Aspiration during  swallow;Pharyngeal residue - valleculae;Reduced airway/laryngeal  closure;Reduced laryngeal elevation;Reduced tongue base  retraction;Trace aspiration Penetration/Aspiration details (thin cup): Material enters  airway, CONTACTS cords and not ejected out;Material enters  airway, passes BELOW cords without attempt by patient to eject  out (silent aspiration) (trace aspiration) Pharyngeal - Solids Pharyngeal - Regular: Not tested (pt. stated she would be unable  to masticate)  Cervical Esophageal Phase    GO    Cervical Esophageal Phase Cervical Esophageal Phase: Darryll Capers         Houston Siren 11/17/2013, 2:56 PM  Orbie Pyo Colvin Caroli.Ed CCC-SLP Pager [phone]      US Abdomen Limited Ruq  11/15/2013   CLINICAL DATA:  Right upper quadrant pain, history of metastatic disease of unknown primary.  EXAM: US ABDOMEN LIMITED - RIGHT UPPER QUADRANT  COMPARISON:  10/16/2013  FINDINGS: Gallbladder:  No definitive gallstones are identified. Echogenic areas are noted within the gallbladder wall with ring down artifact consistent with adenomyomatosis.  Common bile duct:  Diameter: 8.5 mm. This was prominent on the prior CT examination as well.  Liver:  Heterogeneity is identified. There are multiple slightly hyperechoic lesions identified within the right lobe of the liver diffuse smaller lesions noted in the left lobe of the liver. These are consistent with metastatic disease appear to have increased slightly in the interval from the prior exam. The largest of these lesions measures  2.4 cm in greatest dimension.  Note is made of a subcutaneous hypoechoic nodule which corresponds to a lesion seen on prior CT examination. It is increased in size to 12 mm from 9 mm.  IMPRESSION: Increasing hepatic metastatic disease.  Increasing size in a subcutaneous nodule consistent with metastatic disease.  Adenomyomatosis within the gallbladder.  Prominent common bile duct stable from the prior CT.   Electronically Signed   By: Inez Catalina M.D.   On: 11/15/2013 11:23  Microbiology: Recent Results (from the past 240 hour(s))  URINE CULTURE     Status: None   Collection Time    11/12/13 12:01 PM      Result Value Ref Range Status   Specimen Description URINE, CATHETERIZED   Final   Special Requests NONE   Final   Culture  Setup Time     Final   Value: 11/12/2013 23:05     Performed at [facility]     Final   Value: NO GROWTH     Performed at [facility]   Culture     Final   Value: NO GROWTH     Performed at [facility]   Report Status 11/14/2013 FINAL   Final  SURGICAL PCR SCREEN     Status: Abnormal   Collection Time    11/14/13  5:03 AM      Result Value Ref Range Status   MRSA, PCR NEGATIVE  NEGATIVE Final   Staphylococcus aureus POSITIVE (*) NEGATIVE Final   Comment:            The Xpert SA Assay (FDA     approved for NASAL specimens     in patients over 107 years of age),     is one component of     a comprehensive surveillance     program.  Test performance has     been validated by [employer] for patients greater     than or equal to 71 year old.     It is not intended     to diagnose infection nor to     guide or monitor treatment.     Labs: Basic Metabolic Panel:  Recent Labs Lab 11/12/13 1114 11/12/13 1810 11/13/13 0345 11/15/13 0510 11/17/13 0403  NA 138  --  135* 134* 135*  K 4.2  --  3.9 4.0 3.5*  CL 98  --  98 98 100  CO2 27  --  24 23 23   GLUCOSE 116*  --  101* 150* 125*  BUN 15  --  12 15  25*  CREATININE 0.86 0.84 0.78 0.78 0.79  CALCIUM 9.4  --  8.7 8.4 8.0*   Liver Function Tests:  Recent Labs Lab 11/12/13 1114 11/13/13 0345 11/15/13 0510  AST 213* 185* 44*  ALT 136* 156* 73*  ALKPHOS 294* 301* 207*  BILITOT 1.0 1.8* 0.6  PROT 8.4* 7.0 7.3  ALBUMIN 3.1* 2.4* 2.5*   No results found for this basename: LIPASE, AMYLASE,  in the last 168 hours No results found for this basename: AMMONIA,  in the last 168 hours CBC:  Recent Labs Lab 11/12/13 1114 11/12/13 1810 11/13/13 0345 11/15/13 0510 11/17/13 0403  WBC 9.2 8.1 7.7 13.4* 11.5*  NEUTROABS 6.4  --   --   --   --   HGB 12.7 11.3* 11.1* 11.3* 10.7*  HCT 37.0 33.7* 32.9* 32.9* 30.5*  MCV 92.5 93.9 92.7 90.4 90.5  PLT 268 209 243 252 252   Cardiac Enzymes:  Recent Labs Lab 11/12/13 1114  TROPONINI <0.30   BNP: BNP (last 3 results)  Recent Labs  10/16/13 1320  PROBNP 1161.0*   CBG:  Recent Labs Lab 11/16/13 1309 11/16/13 1649 11/16/13 2114 11/17/13 0814 11/17/13 1231  GLUCAP 191* 119* 123* 124* 113*   Signed:  Samanvi Cuccia K  [facility] 11/17/2013, 4:19 PM

## 2013-11-17 NOTE — Progress Notes (Signed)
  This NP followed up with Thurnell Garbe (daughter of patient) for emotional support and to answer questions and address concerns.  Plan is to continue with recommended radiation and discharge is to [facility]  when medically stable.  Family is encouraged to call with questions or concerns.  Wadie Lessen NP  Palliative Medicine Team Team Phone # [phone] Pager [phone]

## 2013-11-17 NOTE — Progress Notes (Signed)
Chart Note  The patient was seen the last few days. She is sleepy most of the time. Very poor performance status. 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 I spoke to her daughter Arbie Cookey Today about her prognosis and treatment options. Unfortunately the patient has very poor prognosis and performance status. I do not think she is a good candidate for any systemic Therapy. I am not even sure about Brain XRT. I strongly recommend just palliative and comfort care. She may benefit from discharge to either [facility] or SNF for end of life care. The daughter agrees. Thank you for taking good care of Sonia Weeks Done.

## 2013-11-17 NOTE — Procedures (Signed)
Objective Swallowing Evaluation: Modified Barium Swallowing Study  Patient Details  Name: Sonia Weeks MRN: [id number] Date of Birth: 05-Jun-1927  Today's Date: 11/17/2013 Time: 9629-5284 SLP Time Calculation (min): 20 min  Past Medical History:  Past Medical History  Diagnosis Date  . Hypertension   . Diabetes mellitus   . Arthritis   . Metastatic cancer 10/26/2013   Past Surgical History:  Past Surgical History  Procedure Laterality Date  . Ectopic pregnancy surgery    . Groin dissection Right 11/14/2013    Procedure: REMOVAL OF RIGHT GROIN NODULE;  Surgeon: Jackolyn Confer, MD;  Location: [facility];  Service: General;  Laterality: Right;   HPI:  Sonia Weeks is a 80 y.o. female who initially presented complaining of increasing fatigue and weakness- s/p fall with HPI: this is a very pleasant woman who fell and was found to have a nondisplaced right greater trochanter fracture.  Chest x-ray on 10/16/2013 showed an abnormal masslike density in the left perihilar region.  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 Janace Hoard with a diagnosis of parotitis and no further information in Epic about this since 01/17/11 per surgery service note. Pt has been found to have a metastatic Ca of uncertain etiology per MD note, imaging studies.  MRI showed "Innumerable widespread intracranial metastatic deposits as described - Posterior fossa lesions predominate, with potential for obstructive hydrocephalus should the RIGHT cerebellar or LEFT paramedian pontine lesions significant enlarge. Subcentimeter foci of presumed acute hemorrhage are associated with a minority of lesions. Incompletely evaluated bulky cervical adenopathy."  Swallow evaluation ordered.       Assessment / Plan / Recommendation Clinical Impression  Dysphagia Diagnosis: Moderate pharyngeal phase dysphagia Clinical impression: Pt. exhibited a moderate motor based pharyngeal  dysphagia due to decreased laryngeal elevation and laryngeal closure resulting in penetration to vocal cords with trace silent aspiration with thins.  Cup sip nectar entered laryngeal vestibule with head in neutral position, however chin tuck appeared to prevent penetration during this study.  Vallecular residue (mild-mod) caused by reduced tongue base residue and decreased with second swallow.  Solid texture not assessed and pt stated she "would be unable to chew" it.  SLP recommends nectar thick liquids and Dys 3 diet texture with chin tuck, small sips, double swallow, no straws and pills whole in applesauce.  Swallow function may decline when/if metastatic cancer progresses.  ST will see pt. while hospitalized.     Treatment Recommendation  Therapy as outlined in treatment plan below    Diet Recommendation Dysphagia 3 (Mechanical Soft);Nectar-thick liquid   Liquid Administration via: Cup;No straw Medication Administration: Whole meds with puree Supervision: Patient able to self feed;Full supervision/cueing for compensatory strategies Compensations: Slow rate;Small sips/bites;Multiple dry swallows after each bite/sip Postural Changes and/or Swallow Maneuvers: Seated upright 90 degrees    Other  Recommendations Oral Care Recommendations: Oral care BID Other Recommendations: Order thickener from pharmacy   Follow Up Recommendations   (TBD)    Frequency and Duration min 2x/week  2 weeks   Pertinent Vitals/Pain No pain            Reason for Referral Objectively evaluate swallowing function   Oral Phase Oral Preparation/Oral Phase Oral Phase: WFL   Pharyngeal Phase Pharyngeal Phase Pharyngeal Phase: Impaired Pharyngeal - Nectar Pharyngeal - Nectar Teaspoon: Pharyngeal residue - valleculae;Reduced laryngeal elevation (min residue) Pharyngeal - Nectar Cup: Penetration/Aspiration during swallow;Reduced airway/laryngeal closure;Reduced laryngeal elevation;Pharyngeal residue -  valleculae;Reduced tongue base retraction Penetration/Aspiration details (nectar cup): Material enters airway, remains ABOVE vocal cords then ejected out;Material enters airway, remains ABOVE vocal cords and not ejected out Pharyngeal - Thin Pharyngeal - Thin Teaspoon: Reduced tongue base retraction;Pharyngeal residue - valleculae (with chin tuck) Pharyngeal - Thin Cup: Penetration/Aspiration during swallow;Pharyngeal residue - valleculae;Reduced airway/laryngeal closure;Reduced laryngeal elevation;Reduced tongue base retraction;Trace aspiration Penetration/Aspiration details (thin cup): Material enters airway, CONTACTS cords and not ejected out;Material enters airway, passes BELOW cords without attempt by patient to eject out (silent aspiration) (trace aspiration) Pharyngeal - Solids Pharyngeal - Regular: Not tested (pt. stated she would be unable to masticate)  Cervical Esophageal Phase    GO    Cervical Esophageal Phase Cervical Esophageal Phase: Darryll Capers         Houston Siren 11/17/2013, 2:56 PM  Orbie Pyo Colvin Caroli.[employer] [phone]

## 2013-11-19 ENCOUNTER — Encounter: Payer: Self-pay | Admitting: Radiation Oncology

## 2013-11-19 NOTE — Progress Notes (Signed)
  Radiation Oncology         [phone] ________________________________  Name: Sonia Weeks MRN: [id number]  Date: 11/17/2013  DOB: 1927-03-06  Weekly Radiation Therapy Management    ICD-9-CM ICD-10-CM   1. Metastasis to brain 198.3 C79.31     Current Dose: 5 Gy     Planned Dose:  35 Gy  Narrative . . . . . . . . The patient presents for routine under treatment assessment.                                   The patient is without complaint.                                 Set-up films were reviewed.                                 The chart was checked. Physical Findings. . .  oral temperature is 98.1 F (36.7 C). Her blood pressure is 159/74 and her pulse is 84. Her respiration is 16 and oxygen saturation is 96%. . Weight essentially stable.  No significant changes. Impression . . . . . . . The patient is tolerating radiation. Plan . . . . . . . . . . . . Continue treatment as planned.  ________________________________  Sheral Apley. [name], M.D.

## 2013-11-20 ENCOUNTER — Other Ambulatory Visit: Payer: Self-pay | Admitting: *Deleted

## 2013-11-20 ENCOUNTER — Telehealth: Payer: Self-pay | Admitting: *Deleted

## 2013-11-20 ENCOUNTER — Ambulatory Visit
Admission: RE | Admit: 2013-11-20 | Discharge: 2013-11-20 | Disposition: A | Discharge status: Home / Self Care | Payer: Medicare Other | Source: Ambulatory Visit | Attending: Radiation Oncology | Admitting: Radiation Oncology

## 2013-11-20 ENCOUNTER — Encounter: Payer: Self-pay | Admitting: *Deleted

## 2013-11-20 DIAGNOSIS — C801 Malignant (primary) neoplasm, unspecified: Secondary | ICD-10-CM | POA: Diagnosis not present

## 2013-11-20 DIAGNOSIS — Z510 Encounter for antineoplastic radiation therapy: Secondary | ICD-10-CM | POA: Diagnosis present

## 2013-11-20 DIAGNOSIS — C7931 Secondary malignant neoplasm of brain: Secondary | ICD-10-CM | POA: Diagnosis not present

## 2013-11-20 MED ORDER — OXYCODONE-ACETAMINOPHEN 5-325 MG PO TABS: 2.0000 | ORAL_TABLET | ORAL | Status: DC | PRN

## 2013-11-20 NOTE — Progress Notes (Signed)
Called [facility] per Dr. Julien Nordmann

## 2013-11-20 NOTE — Telephone Encounter (Signed)
Received call from [place] with Hospice.  Hospice referral has been made, She states that since pt is residing at [facility] the attending MD will need to be the MD at [facility].  She has contacted them regarding this.

## 2013-11-20 NOTE — Telephone Encounter (Signed)
Daughter called left vm message. It was hard to understand the message.  I called back and left vm message to call back.

## 2013-11-20 NOTE — Telephone Encounter (Signed)
[facility] 

## 2013-11-21 ENCOUNTER — Telehealth: Payer: Self-pay | Admitting: Radiation Oncology

## 2013-11-21 ENCOUNTER — Encounter: Payer: Self-pay | Admitting: Internal Medicine

## 2013-11-21 ENCOUNTER — Telehealth: Payer: Self-pay | Admitting: Oncology

## 2013-11-21 ENCOUNTER — Ambulatory Visit: Payer: Medicare Other

## 2013-11-21 ENCOUNTER — Non-Acute Institutional Stay (SKILLED_NURSING_FACILITY): Payer: Medicare Other | Admitting: Internal Medicine

## 2013-11-21 ENCOUNTER — Encounter: Payer: Self-pay | Admitting: *Deleted

## 2013-11-21 DIAGNOSIS — C7931 Secondary malignant neoplasm of brain: Secondary | ICD-10-CM

## 2013-11-21 DIAGNOSIS — C799 Secondary malignant neoplasm of unspecified site: Secondary | ICD-10-CM

## 2013-11-21 DIAGNOSIS — S72001G Fracture of unspecified part of neck of right femur, subsequent encounter for closed fracture with delayed healing: Secondary | ICD-10-CM

## 2013-11-21 DIAGNOSIS — I10 Essential (primary) hypertension: Secondary | ICD-10-CM

## 2013-11-21 DIAGNOSIS — R531 Weakness: Secondary | ICD-10-CM

## 2013-11-21 DIAGNOSIS — C801 Malignant (primary) neoplasm, unspecified: Secondary | ICD-10-CM

## 2013-11-21 DIAGNOSIS — E119 Type 2 diabetes mellitus without complications: Secondary | ICD-10-CM

## 2013-11-21 DIAGNOSIS — R1310 Dysphagia, unspecified: Secondary | ICD-10-CM

## 2013-11-21 DIAGNOSIS — E440 Moderate protein-calorie malnutrition: Secondary | ICD-10-CM

## 2013-11-21 NOTE — Telephone Encounter (Signed)
Sonia Weeks's daughter, Arbie Cookey, called and is concerned that the machine is down.  She said her mother missed treatment yesterday and today and is wondering if she can be switched to another machine.  She would like Dr. Johny Shears nurse to call her at [phone].

## 2013-11-21 NOTE — Progress Notes (Signed)
Patient ID: Sonia Weeks, female   DOB: 07-Feb-1927, 80 y.o.   MRN: [id number]  Provider:  Rexene Edison. Mariea Clonts, D.O., C.M.D.  Location:  [facility]   PCP: Ricke Hey, MD  Code Status: DNR  No Known Allergies  Chief Complaint  Patient presents with  . New Admit To SNF    HPI: 81 y.o. female female was admitted here for short term rehab s/p hospitalization with fall and right greater trochanteric hip fx due to metasatic disease to bone, brain, cervical nodes, maxillary sinus and ethmoid sinuses, right inguinal nodes, and liver.  Notes indicate that she was given a prognosis of only a couple of weeks by oncology.  She is receiving XRT through radiation oncology.  Here she is getting PT, OT, ST.  A palliative care referral was made.  Hospice was recommended by the hospital physicians at discharge.  However, she is now here on rehab benefit.  She would like to live until her home in [place] gets sold.  Apparently, she has two homes.  Her family is helping her with the selling process, but she still feels like this has to be done before she is ready to go and she points upward.  She does have generalized pain intermittently that is dull and achy.    ROS: Review of Systems  Constitutional: Positive for weight loss and malaise/fatigue. Negative for fever and chills.  HENT: Negative for congestion.   Eyes: Negative for blurred vision.  Respiratory: Negative for shortness of breath.   Cardiovascular: Negative for chest pain and leg swelling.  Gastrointestinal: Negative for constipation, blood in stool and melena.  Genitourinary: Negative for dysuria.  Musculoskeletal: Positive for myalgias and joint pain. Negative for falls.  Skin: Negative for rash.  Neurological: Positive for weakness and headaches. Negative for dizziness and loss of consciousness.  Endo/Heme/Allergies: Does not bruise/bleed easily.  Psychiatric/Behavioral: Positive for depression and memory loss.      Past Medical History  Diagnosis Date  . Hypertension   . Diabetes mellitus   . Arthritis   . Metastatic cancer 10/26/2013   Past Surgical History  Procedure Laterality Date  . Ectopic pregnancy surgery    . Groin dissection Right 11/14/2013    Procedure: REMOVAL OF RIGHT GROIN NODULE;  Surgeon: Jackolyn Confer, MD;  Location: [facility];  Service: General;  Laterality: Right;   Social History:   reports that she has been smoking.  She does not have any smokeless tobacco history on file. She reports that she does not drink alcohol or use illicit drugs.  No family history on file.  Medications: Patient's Medications  New Prescriptions   No medications on file  Previous Medications   ACETAMINOPHEN (TYLENOL) 500 MG TABLET    Take 1,000 mg by mouth every 6 (six) hours as needed for moderate pain.   ALBUTEROL (PROVENTIL HFA;VENTOLIN HFA) 108 (90 BASE) MCG/ACT INHALER    Inhale 2 puffs into the lungs every 6 (six) hours as needed for wheezing or shortness of breath.   BISACODYL (DULCOLAX) 10 MG SUPPOSITORY    Place 1 suppository (10 mg total) rectally daily as needed for mild constipation.   DEXAMETHASONE (DECADRON) 4 MG TABLET    Take 1 tablet (4 mg total) by mouth every 6 (six) hours.   DOCUSATE SODIUM 100 MG CAPS    Take 100 mg by mouth 2 (two) times daily.   IBUPROFEN (ADVIL,MOTRIN) 400 MG TABLET    Take 1 tablet (400 mg total) by mouth  every 4 (four) hours as needed for fever, headache or mild pain.   METHOCARBAMOL (ROBAXIN) 500 MG TABLET    Take 1 tablet (500 mg total) by mouth every 6 (six) hours as needed for muscle spasms.   NAPROXEN (NAPROSYN) 500 MG TABLET    Take 500 mg by mouth 2 (two) times daily as needed for moderate pain.   OXYCODONE-ACETAMINOPHEN (PERCOCET/ROXICET) 5-325 MG PER TABLET    Take 2 tablets by mouth every 4 (four) hours as needed for moderate pain.   VERAPAMIL (COVERA HS) 180 MG (CO) 24 HR TABLET    Take 180 mg by mouth at bedtime.    Modified Medications    No medications on file  Discontinued Medications   No medications on file     Physical Exam: Filed Vitals:   11/21/13 1055  BP: 180/90  Height: 5\' 2"  (1.575 m)  Weight: 121 lb (54.885 kg)  Physical Exam  Constitutional: She is oriented to person, place, and time. No distress.  HENT:  Head: Normocephalic and atraumatic.  Right Ear: External ear normal.  Left Ear: External ear normal.  Nose: Nose normal.  Mouth/Throat: Oropharynx is clear and moist. No oropharyngeal exudate.  Eyes: Conjunctivae and EOM are normal. Pupils are equal, round, and reactive to light.  Neck: Normal range of motion.  Large visible lymphadenopathy beneath left ear and right ear (left greater than right), also smaller palpable adenopathy along cervical chain  Cardiovascular: Normal rate, regular rhythm, normal heart sounds and intact distal pulses.   Pulmonary/Chest: Effort normal and breath sounds normal. No respiratory distress.  Abdominal: Soft. Bowel sounds are normal. She exhibits no distension. There is no tenderness.  Musculoskeletal: Normal range of motion. She exhibits tenderness.  Left hip  Neurological: She is alert and oriented to person, place, and time.  Skin: Skin is warm and dry.  Psychiatric: She has a normal mood and affect.    Labs reviewed: Basic Metabolic Panel:  Recent Labs  11/13/13 0345 11/15/13 0510 11/17/13 0403  NA 135* 134* 135*  K 3.9 4.0 3.5*  CL 98 98 100  CO2 24 23 23   GLUCOSE 101* 150* 125*  BUN 12 15 25*  CREATININE 0.78 0.78 0.79  CALCIUM 8.7 8.4 8.0*   Liver Function Tests:  Recent Labs  11/12/13 1114 11/13/13 0345 11/15/13 0510  AST 213* 185* 44*  ALT 136* 156* 73*  ALKPHOS 294* 301* 207*  BILITOT 1.0 1.8* 0.6  PROT 8.4* 7.0 7.3  ALBUMIN 3.1* 2.4* 2.5*    Recent Labs  10/16/13 1320  LIPASE 18   No results for input(s): AMMONIA in the last 8760 hours. CBC:  Recent Labs  11/12/13 1114  11/13/13 0345 11/15/13 0510 11/17/13 0403  WBC  9.2  < > 7.7 13.4* 11.5*  NEUTROABS 6.4  --   --   --   --   HGB 12.7  < > 11.1* 11.3* 10.7*  HCT 37.0  < > 32.9* 32.9* 30.5*  MCV 92.5  < > 92.7 90.4 90.5  PLT 268  < > 243 252 252  < > = values in this interval not displayed. Cardiac Enzymes:  Recent Labs  11/12/13 1114  TROPONINI <0.30   BNP: Invalid input(s): POCBNP CBG:  Recent Labs  11/17/13 0814 11/17/13 1231 11/17/13 1647  GLUCAP 124* 113* 137*    Imaging and Procedures: Dg Hip Complete Right  11/12/2013 CLINICAL DATA: Patient fell in her home this morning injuring right hip. Right hip pain. EXAM: RIGHT  HIP - COMPLETE 2+ VIEW COMPARISON: None. FINDINGS: No fracture. Hip joint is normally aligned. Bones are diffusely demineralized. Pelvis is intact. SI joints and symphysis pubis are normally aligned. Bones are diffusely demineralized. Soft tissues are unremarkable. IMPRESSION: No fracture or dislocation. Electronically Signed By: Lajean Manes M.D. On: 11/12/2013 11:39   Ct Head Wo Contrast  11/12/2013 CLINICAL DATA: Status post fall is more known with injury/laceration. EXAM: CT HEAD WITHOUT CONTRAST TECHNIQUE: Contiguous axial images were obtained from the base of the skull through the vertex without intravenous contrast. COMPARISON: CT neck 01/13/2011 FINDINGS: Ventricles are normal in size. Negative for intra or extra-axial hemorrhage, mass effect, mass lesion, or evidence of acute cortically based infarction. Mild chronic microvascular ischemic changes in the periventricular white matter bilaterally. Dural calcifications noted near the vertex. Extensive sinus disease noted with frothy secretions and air-fluid levels in both maxillary sinuses and scattered fluid in the ethmoid air cells. Visualized mastoid air cells are clear. Inferior aspect of the right mastoid air cells not included on the images. Skull is somewhat thickened. Negative for skull fracture. Mandibular condyles located. Soft  tissues of the scalp are symmetric and there is no evidence of scalp hematoma. IMPRESSION: Acute appearing bilateral axillary and ethmoid sinusitis. Negative for scalp hematoma or skull fracture. No acute intracranial abnormality. Electronically Signed By: Curlene Dolphin M.D. On: 11/12/2013 11:37   Mr Jeri Cos UX Contrast  11/13/2013 CLINICAL DATA: Staging of widely metastatic disease of unknown primary, pending tissue diagnosis. EXAM: MRI HEAD WITHOUT AND WITH CONTRAST TECHNIQUE: Multiplanar, multiecho pulse sequences of the brain and surrounding structures were obtained without and with intravenous contrast. CONTRAST: 52mL MULTIHANCE GADOBENATE DIMEGLUMINE 529 MG/ML IV SOLN COMPARISON: CT head 11/12/2013. FINDINGS: There are innumerable widespread intracranial metastatic deposits which are concentrated in the posterior fossa. The largest lesion involves the RIGHT cerebellar hemisphere, and is surrounded by mild vasogenic edema with slight mass effect on the fourth ventricle. It measures 21 x 22 x 20 mm (R-L x A-P x C-C). The largest brainstem lesion is seen in the LEFT paramedian pons measuring 15 x 13 x 13 mm. Extensive widespread supratentorial metastatic lesions are largely subcentimeter in size. The majority lesions show contrast enhancement with central necrosis although some are solid. Subcentimeter foci of presumed acute hemorrhage (or melanin) is associated with only a small proportion of lesions, including the LEFT paramedian pontine lesion, the LEFT cerebellar peripheral hemispheric lesion, a LEFT frontal subependymal lesion near the caudate, as well as a LEFT posterior frontal cortical lesion. Generalized atrophy. Chronic microvascular ischemic change in addition to T2 and FLAIR hyperintensity surrounding the multiple metastatic deposits. No tonsillar herniation. No pituitary lesion. No impending hydrocephalus. No midline shift. Flow voids are maintained. Negative orbits. Acute  BILATERAL maxillary sinus and ethmoid disease. Slight heterogeneity of marrow signal without focal osseous lesion. No definite meningeal enhancement. There is bulky cervical lymphadenopathy incompletely evaluated. As seen on coronal imaging, presumed lymphadenopathy is intimately involved with both the LEFT and RIGHT parotid glands. BILATERAL parotid masses are not excluded, but given the findings on chest and abdomen CT, cervical lymphadenopathy is favored. IMPRESSION: Innumerable widespread intracranial metastatic deposits as described. Posterior fossa lesions predominate, with potential for obstructive hydrocephalus should the RIGHT cerebellar or LEFT paramedian pontine lesions significant enlarge. Subcentimeter foci of presumed acute hemorrhage are associated with a minority of lesions. Incompletely evaluated bulky cervical adenopathy. Electronically Signed By: Rolla Flatten M.D. On: 11/13/2013 08:20   Ct Hip Right Wo Contrast  11/12/2013 CLINICAL DATA: Fall, right  hip pain EXAM: CT OF THE RIGHT HIP WITHOUT CONTRAST TECHNIQUE: Multidetector CT imaging of the right hip was performed according to the standard protocol. Multiplanar CT image reconstructions were also generated. COMPARISON: Right hip radiographs dated 11/12/2013 FINDINGS: Nondisplaced fracture involving the right greater trochanter (series 4/image 52). Right femoral neck is intact. Mild degenerative changes of the right hip. Visualized pelvic soft tissues are notable for vascular calcifications but are otherwise within normal limits. IMPRESSION: Nondisplaced fracture involving the right greater trochanter. Electronically Signed By: Julian Hy M.D. On: 11/12/2013 15:30   Chest Portable 1 View  11/12/2013 CLINICAL DATA: Smoker, preoperative evaluation for hip fracture. Personal history hypertension, diabetes, smoking, metastatic cancer EXAM: PORTABLE CHEST - 1 VIEW COMPARISON: Portable exam 1759 hr  compared to 10/16/2013 FINDINGS: Normal heart size, mediastinal contours and pulmonary vascularity. Atherosclerotic calcification aortic arch. LEFT perihilar mass again identified, poorly defined, appears slightly more prominent versus prior study though this may be related to AP versus PA technique. Few poorly defined pulmonary nodules are questioned in the lower RIGHT lung. Underlying emphysematous changes. No pleural effusion or pneumothorax. Bones demineralized. IMPRESSION: Emphysematous changes with persistent RIGHT perihilar mass in question subtle nodular densities in the lower RIGHT lung. Electronically Signed By: Lavonia Dana M.D. On: 11/12/2013 18:29   Dg Swallowing Func-speech Pathology  11/17/2013 Orbie Pyo [place], CCC-SLP 11/17/2013 2:56 PM Objective Swallowing Evaluation: Modified Barium Swallowing Study Patient Details Name: Sonia Weeks MRN: [id number] Date of Birth: 1927-04-02 Today's Date: 11/17/2013 Time: 72-1405 SLP Time Calculation (min): 20 min Past Medical History: Past Medical History Diagnosis Date . Hypertension . Diabetes mellitus . Arthritis . Metastatic cancer 10/26/2013 Past Surgical History: Past Surgical History Procedure Laterality Date . Ectopic pregnancy surgery . Groin dissection Right 11/14/2013 Procedure: REMOVAL OF RIGHT GROIN NODULE; Surgeon: Jackolyn Confer, MD; Location: [facility]; Service: General; Laterality: Right; HPI: Ms. Niesen is a 80 y.o. female who initially presented complaining of increasing fatigue and weakness- s/p fall with HPI: this is a very pleasant woman who fell and was found to have a nondisplaced right greater trochanter fracture. Chest x-ray on 10/16/2013 showed an abnormal masslike density in the left perihilar region. 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 Janace Hoard with a diagnosis of parotitis and no further  information in Epic about this since 01/17/11 per surgery service note. Pt has been found to have a metastatic Ca of uncertain etiology per MD note, imaging studies. MRI showed "Innumerable widespread intracranial metastatic deposits as described - Posterior fossa lesions predominate, with potential for obstructive hydrocephalus should the RIGHT cerebellar or LEFT paramedian pontine lesions significant enlarge. Subcentimeter foci of presumed acute hemorrhage are associated with a minority of lesions. Incompletely evaluated bulky cervical adenopathy." Swallow evaluation ordered. Assessment / Plan / Recommendation Clinical Impression Dysphagia Diagnosis: Moderate pharyngeal phase dysphagia Clinical impression: Pt. exhibited a moderate motor based pharyngeal dysphagia due to decreased laryngeal elevation and laryngeal closure resulting in penetration to vocal cords with trace silent aspiration with thins. Cup sip nectar entered laryngeal vestibule with head in neutral position, however chin tuck appeared to prevent penetration during this study. Vallecular residue (mild-mod) caused by reduced tongue base residue and decreased with second swallow. Solid texture not assessed and pt stated she "would be unable to chew" it. SLP recommends nectar thick liquids and Dys 3 diet texture with chin tuck, small sips, double swallow, no straws and pills whole in applesauce. Swallow function may  decline when/if metastatic cancer progresses. ST will see pt. while hospitalized. Treatment Recommendation Therapy as outlined in treatment plan below Diet Recommendation Dysphagia 3 (Mechanical Soft);Nectar-thick liquid Liquid Administration via: Cup;No straw Medication Administration: Whole meds with puree Supervision: Patient able to self feed;Full supervision/cueing for compensatory strategies Compensations: Slow rate;Small sips/bites;Multiple dry swallows after each bite/sip Postural  Changes and/or Swallow Maneuvers: Seated upright 90 degrees Other Recommendations Oral Care Recommendations: Oral care BID Other Recommendations: Order thickener from pharmacy Follow Up Recommendations (TBD) Frequency and Duration min 2x/week 2 weeks Pertinent Vitals/Pain No pain  Reason for Referral Objectively evaluate swallowing function Oral Phase Oral Preparation/Oral Phase Oral Phase: WFL Pharyngeal Phase Pharyngeal Phase Pharyngeal Phase: Impaired Pharyngeal - Nectar Pharyngeal - Nectar Teaspoon: Pharyngeal residue - valleculae;Reduced laryngeal elevation (min residue) Pharyngeal - Nectar Cup: Penetration/Aspiration during swallow;Reduced airway/laryngeal closure;Reduced laryngeal elevation;Pharyngeal residue - valleculae;Reduced tongue base retraction Penetration/Aspiration details (nectar cup): Material enters airway, remains ABOVE vocal cords then ejected out;Material enters airway, remains ABOVE vocal cords and not ejected out Pharyngeal - Thin Pharyngeal - Thin Teaspoon: Reduced tongue base retraction;Pharyngeal residue - valleculae (with chin tuck) Pharyngeal - Thin Cup: Penetration/Aspiration during swallow;Pharyngeal residue - valleculae;Reduced airway/laryngeal closure;Reduced laryngeal elevation;Reduced tongue base retraction;Trace aspiration Penetration/Aspiration details (thin cup): Material enters airway, CONTACTS cords and not ejected out;Material enters airway, passes BELOW cords without attempt by patient to eject out (silent aspiration) (trace aspiration) Pharyngeal - Solids Pharyngeal - Regular: Not tested (pt. stated she would be unable to masticate) Cervical Esophageal Phase GO Cervical Esophageal Phase Cervical Esophageal Phase: Darryll Capers [place] Siren 11/17/2013, 2:56 PM Orbie Pyo Colvin Caroli.Ed CCC-SLP Pager [phone]   US Abdomen Limited Ruq  11/15/2013 CLINICAL DATA: Right upper quadrant pain, history of metastatic  disease of unknown primary. EXAM: US ABDOMEN LIMITED - RIGHT UPPER QUADRANT COMPARISON: 10/16/2013 FINDINGS: Gallbladder: No definitive gallstones are identified. Echogenic areas are noted within the gallbladder wall with ring down artifact consistent with adenomyomatosis. Common bile duct: Diameter: 8.5 mm. This was prominent on the prior CT examination as well. Liver: Heterogeneity is identified. There are multiple slightly hyperechoic lesions identified within the right lobe of the liver diffuse smaller lesions noted in the left lobe of the liver. These are consistent with metastatic disease appear to have increased slightly in the interval from the prior exam. The largest of these lesions measures 2.4 cm in greatest dimension. Note is made of a subcutaneous hypoechoic nodule which corresponds to a lesion seen on prior CT examination. It is increased in size to 12 mm from 9 mm. IMPRESSION: Increasing hepatic metastatic disease. Increasing size in a subcutaneous nodule consistent with metastatic disease. Adenomyomatosis within the gallbladder. Prominent common bile duct stable from the prior CT. Electronically Signed By: Inez Catalina M.D. On: 11/15/2013 11:23   Assessment/Plan 1. Metastatic cancer Found when she broke her hip Diffusely--in brain, sinuses, bones, nodes, liver  Getting xrt at cancer center Intake poor, very weak and increasingly frail Discussed idea of hospice care, but she says she wants to live to sell her one house and then she can go to heaven Appears her pain is controlled Palliative care referral ordered also  2. Malnutrition of moderate degree -cont supplements and encouraging po intake  3. Weakness generalized PT, OT, ST here Due to her cancer   4. Hip fracture, right, closed, with delayed healing, subsequent encounter -pain controlled at this point -cont to monitor Working with therapy  5. Metastasis to brain -is getting XRT at [facility] for  swelling and pain  6. Type 2 diabetes mellitus without complication -cont low dose novolog 5 units for cbg  Over 150 at meals; is on steroids also for her brain mets  7. Essential hypertension -bp at goal with verapamil  8. Dysphagia -working with ST -concho nas mechanical soft with honey thickened liquids, 2 call supplement 120 cc bid  Functional status:  Touchdown weight bearing right leg, dependent with bathing, dressing, transfers  Family/ staff Communication: seen with unit supervisor  Labs/tests ordered: palliative care referral

## 2013-11-21 NOTE — [facility] Oncology Navigator Note (Unsigned)
Patient's daughter called.  She was concerned that the radiation machine her mother is getting treatment from.  According to her, the machine is"broken".  She asked if there was another machine.  I asked that she call radiation oncology and ask the nurses if there was another machine.  She stated she would call them.

## 2013-11-21 NOTE — Telephone Encounter (Signed)
Promptly returned message left by patient's daughter, Arbie Cookey. Apologized for the machine going down. Explained that her mother did receive treatment yesterday. Explained that if the machine is down again tomorrow her mother will be re planned for another machine. Reinforced our staff will do everything possible to ensure her mother receives her necessary treatment. Arbie Cookey verbalized understanding and expressed appreciation for the call.

## 2013-11-21 NOTE — Telephone Encounter (Signed)
Faxed patient's schedule to Daphene Jaeger at [facility]. Confirmation fax of delivery obtained.

## 2013-11-22 ENCOUNTER — Ambulatory Visit
Admit: 2013-11-22 | Discharge: 2013-11-22 | Disposition: A | Discharge status: Home / Self Care | Payer: Medicare Other | Attending: Radiation Oncology | Admitting: Radiation Oncology

## 2013-11-22 DIAGNOSIS — Z510 Encounter for antineoplastic radiation therapy: Secondary | ICD-10-CM | POA: Diagnosis not present

## 2013-11-23 ENCOUNTER — Ambulatory Visit
Admission: RE | Admit: 2013-11-23 | Discharge: 2013-11-23 | Disposition: A | Discharge status: Home / Self Care | Payer: Medicare Other | Source: Ambulatory Visit | Attending: Radiation Oncology | Admitting: Radiation Oncology

## 2013-11-23 ENCOUNTER — Ambulatory Visit
Admit: 2013-11-23 | Discharge: 2013-11-23 | Disposition: A | Discharge status: Home / Self Care | Payer: Medicare Other | Attending: Radiation Oncology | Admitting: Radiation Oncology

## 2013-11-23 ENCOUNTER — Encounter: Payer: Self-pay | Admitting: Radiation Oncology

## 2013-11-23 VITALS — BP 144/88 | HR 86 | Temp 97.7°F | Resp 20

## 2013-11-23 DIAGNOSIS — C7931 Secondary malignant neoplasm of brain: Secondary | ICD-10-CM

## 2013-11-23 DIAGNOSIS — Z510 Encounter for antineoplastic radiation therapy: Secondary | ICD-10-CM | POA: Diagnosis not present

## 2013-11-23 NOTE — Progress Notes (Signed)
  Radiation Oncology         [phone] ________________________________  Name: Sonia Weeks MRN: [id number]  Date: 11/23/2013  DOB: 08/09/1927  Weekly Radiation Therapy Management    ICD-9-CM ICD-10-CM   1. Metastasis to brain 198.3 C79.31     Current Dose: 12.5Gy     Planned Dose:  35 Gy  Narrative . . . . . . . . The patient presents for routine under treatment assessment.                                   Patient unable to stand up for weight. She states she is very weak, fatigued. She states she cannot take 9 more treatments. She states "I hurt all over." She states she takes Tylenol prn. She states this pain just began recently, and she cannot pinpoint specific pains.  She denies headache, states she has been nauseated and vomited x 1 today. She has loss of appetite. According to her SNF med rec she is taking Decadron 4 mg every 6 hours.                                  Set-up films were reviewed.                                 The chart was checked. Physical Findings. . .  temperature is 97.7 F (36.5 C). Her blood pressure is 144/88 and her pulse is 86. Her respiration is 20. . Weight essentially stable.  No significant changes. Impression . . . . . . . The patient is tolerating radiation. Plan . . . . . . . . . . . . Continue treatment as planned.  The patient has expressed some concerns about continuing radiation, since her prognosis is so poor.  I explained that the purpose of radiation is to help maximize her function during her final days, for palliation.  I will call her daughter to discuss goals of care and help the patient decide about whether to continue radiation. ________________________________  Sheral Apley [name], M.D.

## 2013-11-23 NOTE — Progress Notes (Signed)
Patient unable to stand up for weight. She states she is very weak, fatigued. She states she cannot take 9 more treatments. She states "I hurt all over." She states she takes Tylenol prn. She states this pain just began recently, and she cannot pinpoint specific pains.  She denies headache, states she has been nauseated and vomited x 1 today. She has loss of appetite. According to her SNF med rec she is taking Decadron 4 mg every 6 hours.

## 2013-11-23 NOTE — Progress Notes (Signed)
  Radiation Oncology         [phone] ________________________________  Name: Sonia Weeks MRN: [id number]  Date: 11/23/2013  DOB: 1927-03-19  Chart Note:  This evening, I spoke with the patient's daughter by telephone. We discussed the pros and cons of continuing her of whole brain radiation. The patient's daughter is out of town in [place] right now but will return on Friday evening. She would like to continue radiation at least through Friday evening and take the weekend to decide with her mom about how to proceed. She plans to contact our office Monday morning if they decide to discontinue further radiotherapy.  ________________________________  Sheral Apley. [name], M.D.

## 2013-11-24 ENCOUNTER — Encounter: Payer: Self-pay | Admitting: Radiation Oncology

## 2013-11-24 ENCOUNTER — Ambulatory Visit
Admission: RE | Admit: 2013-11-24 | Discharge: 2013-11-24 | Disposition: A | Discharge status: Home / Self Care | Payer: Medicare Other | Source: Ambulatory Visit | Attending: Radiation Oncology | Admitting: Radiation Oncology

## 2013-11-24 DIAGNOSIS — Z510 Encounter for antineoplastic radiation therapy: Secondary | ICD-10-CM | POA: Diagnosis not present

## 2013-11-27 ENCOUNTER — Ambulatory Visit: Payer: Medicare Other

## 2013-11-27 DIAGNOSIS — R1310 Dysphagia, unspecified: Secondary | ICD-10-CM | POA: Insufficient documentation

## 2013-11-28 ENCOUNTER — Encounter: Payer: Self-pay | Admitting: Internal Medicine

## 2013-11-28 ENCOUNTER — Non-Acute Institutional Stay (SKILLED_NURSING_FACILITY): Admitting: Internal Medicine

## 2013-11-28 ENCOUNTER — Ambulatory Visit: Payer: Medicare Other

## 2013-11-28 ENCOUNTER — Telehealth: Payer: Self-pay | Admitting: *Deleted

## 2013-11-28 DIAGNOSIS — S72111D Displaced fracture of greater trochanter of right femur, subsequent encounter for closed fracture with routine healing: Secondary | ICD-10-CM

## 2013-11-28 DIAGNOSIS — M549 Dorsalgia, unspecified: Secondary | ICD-10-CM | POA: Diagnosis not present

## 2013-11-28 DIAGNOSIS — C801 Malignant (primary) neoplasm, unspecified: Secondary | ICD-10-CM

## 2013-11-28 DIAGNOSIS — C799 Secondary malignant neoplasm of unspecified site: Secondary | ICD-10-CM

## 2013-11-28 NOTE — Progress Notes (Signed)
Patient ID: Sonia Weeks, female   DOB: January 12, 1928, 80 y.o.   MRN: [id number]   Place of Service: [facility]  No Known Allergies  Code Status: DNR  Goals of Care: Comfort and Quality of Life  Chief Complaint  Patient presents with  . Discharge Note    HPI 80 y.o. female with PMH of metastatic cancer, HTN, OA, DM2 among others is being seen for a discharge visit. Patient was originally here for short-term rehabilitation. Due to poor prognosis r/t to metastatic ca, the patient and her family wish to go home with home hospice.   Review of Systems Unable to obtain but patient was able to report having severe back pain.   Past Medical History  Diagnosis Date  . Hypertension   . Diabetes mellitus   . Arthritis   . Metastatic cancer 10/26/2013    Past Surgical History  Procedure Laterality Date  . Ectopic pregnancy surgery    . Groin dissection Right 11/14/2013    Procedure: REMOVAL OF RIGHT GROIN NODULE;  Surgeon: Jackolyn Confer, MD;  Location: [facility];  Service: General;  Laterality: Right;    History   Social History  . Marital Status: Widowed    Spouse Name: N/A    Number of Children: N/A  . Years of Education: N/A   Occupational History  . Not on file.   Social History Main Topics  . Smoking status: Current Every Day Smoker -- 0.25 packs/day  . Smokeless tobacco: Not on file  . Alcohol Use: No  . Drug Use: No  . Sexual Activity: Not on file   Other Topics Concern  . Not on file   Social History Narrative      Medication List       This list is accurate as of: 11/28/13  5:30 PM.  Always use your most recent med list.               dexamethasone 4 MG tablet  Commonly known as:  DECADRON  Take 1 tablet (4 mg total) by mouth every 6 (six) hours.     morphine 20 MG/ML concentrated solution  Commonly known as:  ROXANOL  Take 5 mg by mouth every 2 (two) hours as needed for moderate pain, severe pain, breakthrough pain or  shortness of breath.        Physical Exam Filed Vitals:   11/28/13 1721  BP: 182/80  Pulse: 74  Temp: 97.5 F (36.4 C)  Resp: 20   Constitutional: thin elderly female in moderate distress.  HEENT: Normocephalic and atraumatic. PERRL. EOM intact. No icterus.  Neck: Supple and nontender. No JVD or carotid bruits. Cardiac: Normal S1, S2. RRR without appreciable murmurs, rubs, or gallops. Distal pulses intact. No dependent edema.  Lungs: No respiratory distress. Breath sounds clear bilaterally without rales, rhonchi, or wheezes. Abdomen: Audible bowel sounds in all quadrants. Soft, nontender, nondistended. No palpable mass.  Musculoskeletal: Back tender to palpation Skin: Warm and dry. No rash noted. No erythema.  Neurological: Alert and oriented to person Psychiatric:  Appropriate mood and affect.   Assessment & Plan 1. Metastatic cancer Patient is actively dying.  Will d/c patient home with [facility] [place]. Continue decadron 4mg  Q8H.   2. Fracture of greater trochanter of right femur, closed, with routine healing, subsequent encounter No issues  3. Back pain Roxanol 20mg /mL: 5mg  Q2H PRN for shortness of breath and pain.    Home health services:Home hospice with [facility] [place] 30-day supply of prescription medications provided  Family/Staff Communication Plan of care discuss with patient and professional staff members. Patient and professional staff members verbalize understanding and agree with plan of care. No additional questions or concerns reported.    Arthur Holms, MSN, AGNP-C [facility] [address] [place], [place] [address] [phone] [8am-5pm] After hours: [phone]

## 2013-11-28 NOTE — Telephone Encounter (Signed)
PT.'S FINAL DIAGNOSIS WAS UNKNOWN PRIMARY. DR.MOHAMED'S 10/26/13 DICTATION WAS FAXED TO HOSPICE.

## 2013-11-29 ENCOUNTER — Ambulatory Visit: Payer: Medicare Other

## 2013-11-30 ENCOUNTER — Other Ambulatory Visit: Payer: Self-pay | Admitting: *Deleted

## 2013-11-30 ENCOUNTER — Ambulatory Visit: Payer: Medicare Other

## 2013-12-01 ENCOUNTER — Ambulatory Visit: Payer: Medicare Other | Admitting: Radiation Oncology

## 2013-12-01 ENCOUNTER — Ambulatory Visit: Payer: Medicare Other

## 2013-12-04 ENCOUNTER — Ambulatory Visit: Payer: Medicare Other

## 2013-12-05 ENCOUNTER — Ambulatory Visit: Payer: Medicare Other

## 2013-12-06 ENCOUNTER — Ambulatory Visit: Payer: Medicare Other

## 2013-12-13 NOTE — Progress Notes (Signed)
  Radiation Oncology         [phone] ________________________________  Name: NAKEYA ADINOLFI MRN: [id number]  Date: 11/24/2013  DOB: 1927-04-28  End of Treatment Note     ICD-9-CM ICD-10-CM  1. Metastasis to brain 198.3 C79.31    DIAGNOSIS: 80 year old woman with numerous brain metastases     Indication for treatment:  Palliation       Radiation treatment dates:   11/16/13-11/24/13  Site/dose:   The whole brain was treated to 15 Gy in 6 fractions of 2.5 Gy  Beams/energy:   Right and Left radiation fields were treated using 6 MV X-rays with custom MLC collimation to shield the eyes and face.  The patient was immobilized with a thermoplastic mask and isocenter was verified with weekly port films.  Narrative: The patient tolerated radiation treatment relatively well.   However, due to declining performance status, his course was discontinued.  Plan: The patient has completed radiation treatment.   ________________________________  Sheral Apley. [name], M.D.

## 2013-12-19 DEATH — deceased

## 2015-07-09 ENCOUNTER — Encounter: Payer: Self-pay | Admitting: Radiation Oncology

## 2015-07-09 NOTE — Progress Notes (Signed)
Patient's daughter, Thurnell Garbe, signed medical release with Verdie Drown for mother's medical record. Reviewed records with daughter to clarify her mother's radiation start date and end date. Answered all other questions to the best of my ability. Ms. Rosana Hoes verbalized understanding and expressed appreciation for time spent.

## 2015-07-10 ENCOUNTER — Telehealth: Payer: Self-pay | Admitting: *Deleted

## 2015-07-10 NOTE — Telephone Encounter (Signed)
On 07-09-15 the patient daughter came by to pick up medical records . It was consult  Note, end of tx note, sim and planning note,

## 2015-12-12 IMAGING — CT CT HIP*R* W/O CM
1 of 3 series · 15 of 32 positions shown, 20 images · non-contrast
Comparison: Right hip radiographs dated 11/12/2013

CLINICAL DATA: Fall, right hip pain

EXAM:
CT OF THE RIGHT HIP WITHOUT CONTRAST
TECHNIQUE: Multidetector CT imaging of the right hip was performed according to
the standard protocol. Multiplanar CT image reconstructions were
also generated.

[Series 602: axial · axial · 0.45mm/px · z∈[-444,-239]mm · 15 of 230 slices shown, 20 images]
[im 13/230  soft-tissue]
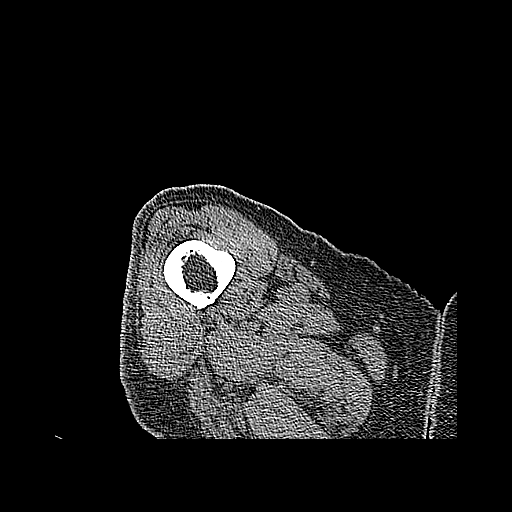
[im 13/230  bone]
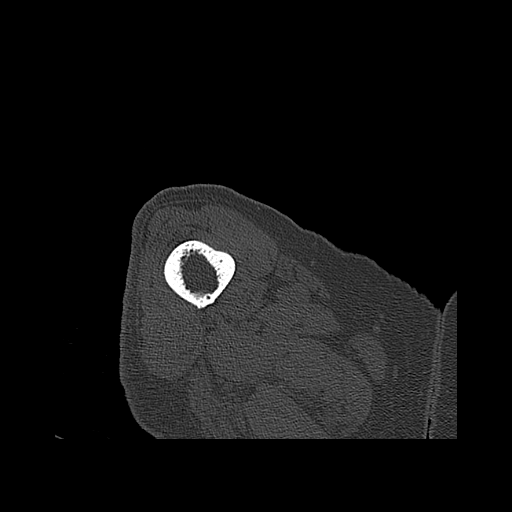
[im 26/230  soft-tissue]
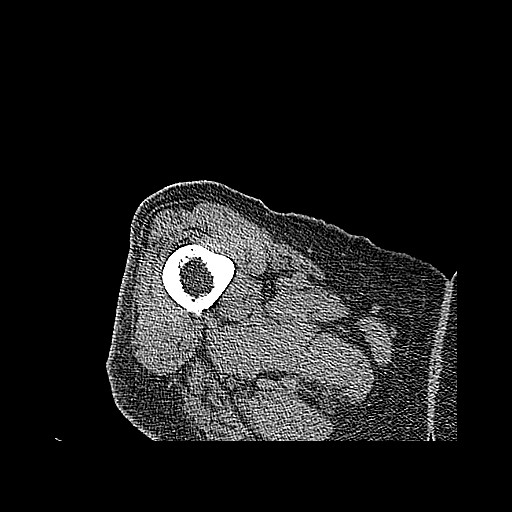
[im 51/230  soft-tissue]
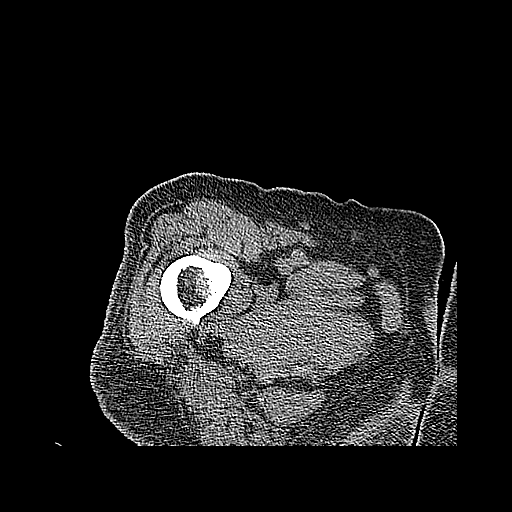
[im 64/230  soft-tissue]
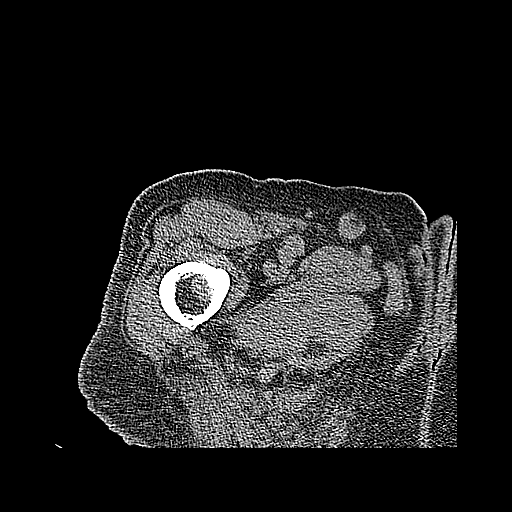
[im 77/230  soft-tissue]
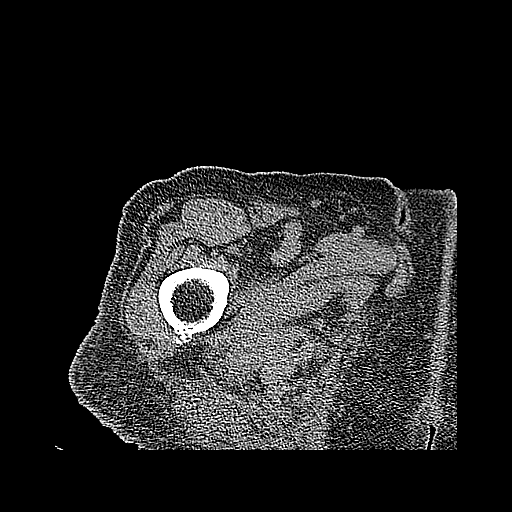
[im 90/230  soft-tissue]
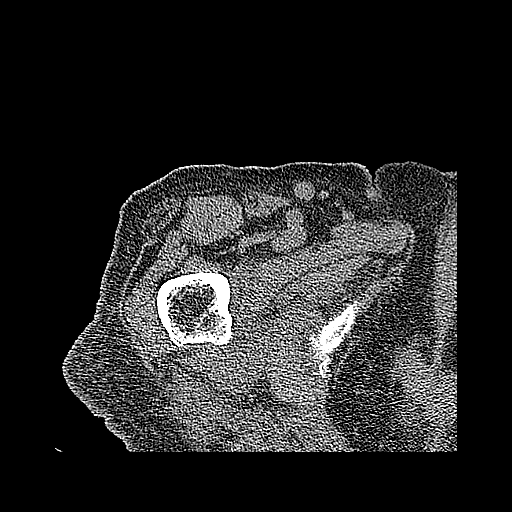
[im 102/230  soft-tissue]
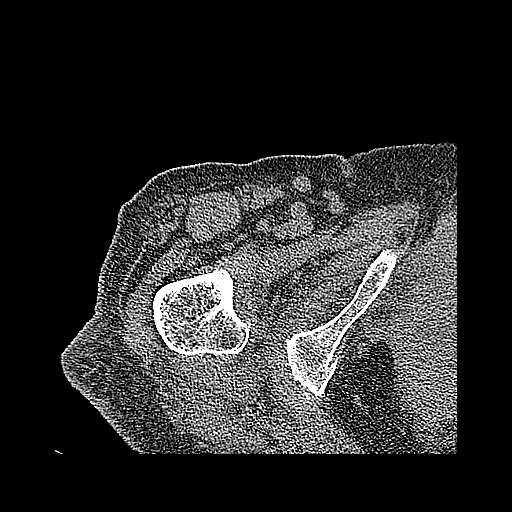
[im 128/230  soft-tissue]
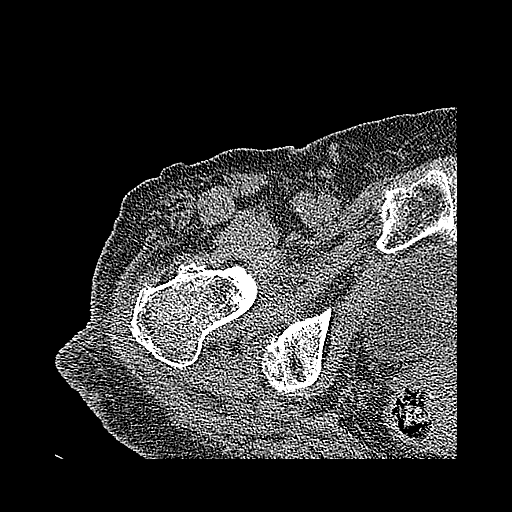
[im 140/230  soft-tissue]
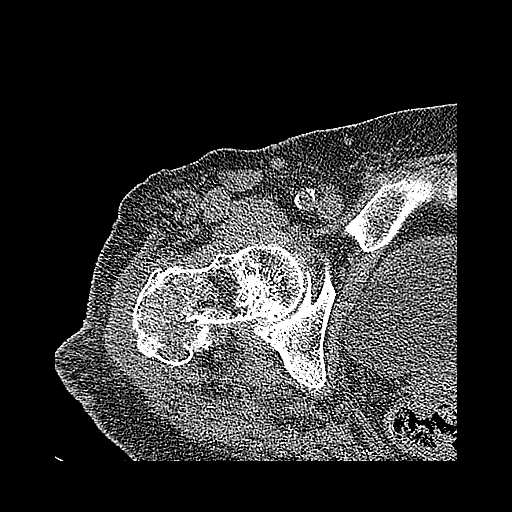
[im 140/230  bone]
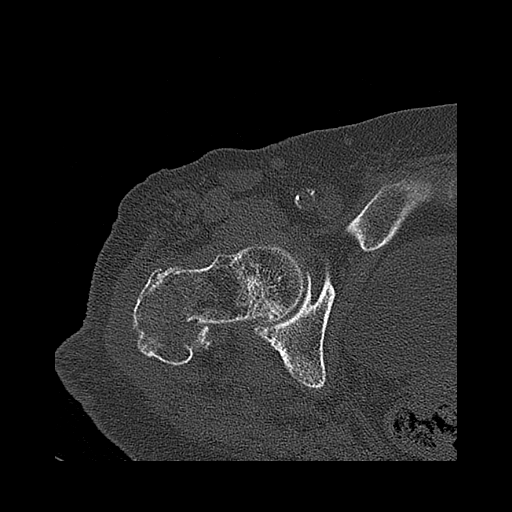
[im 153/230  soft-tissue]
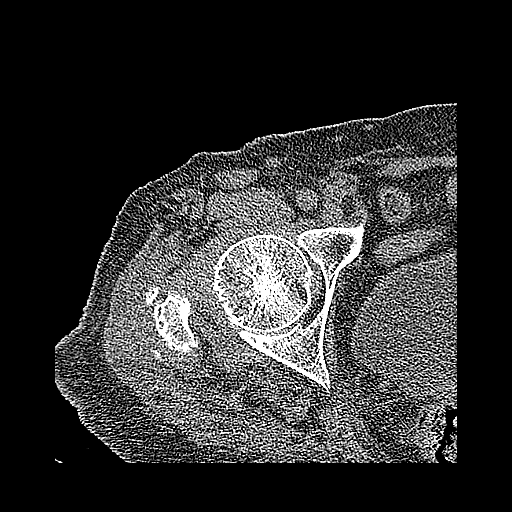
[im 166/230  soft-tissue]
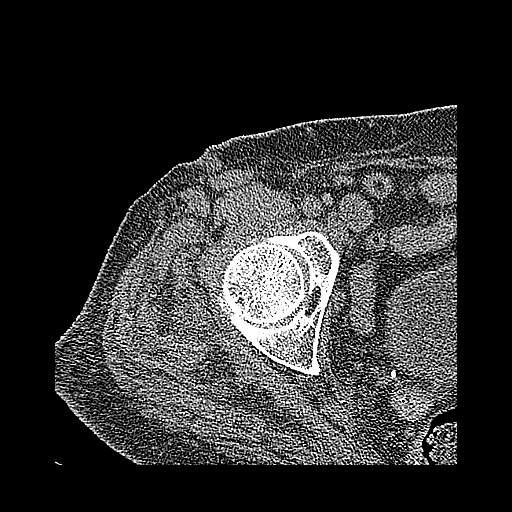
[im 179/230  soft-tissue]
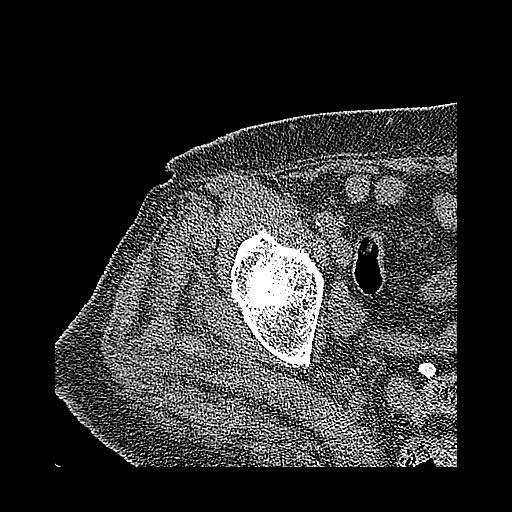
[im 179/230  lung]
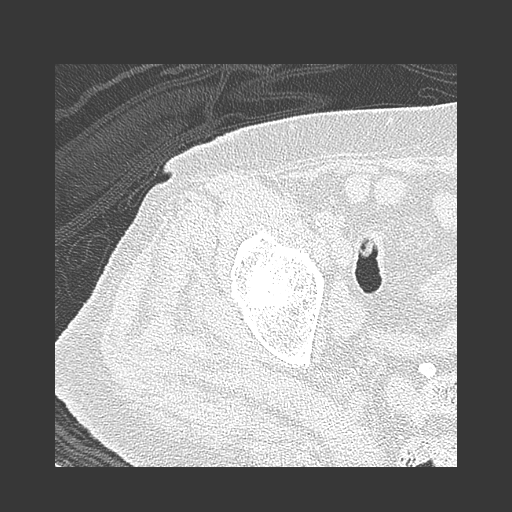
[im 191/230  lung]
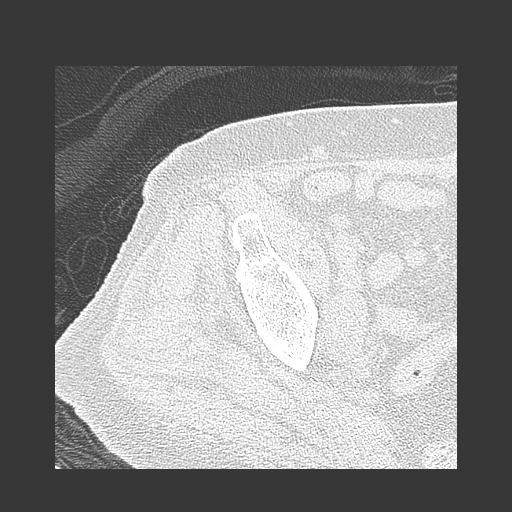
[im 204/230  soft-tissue]
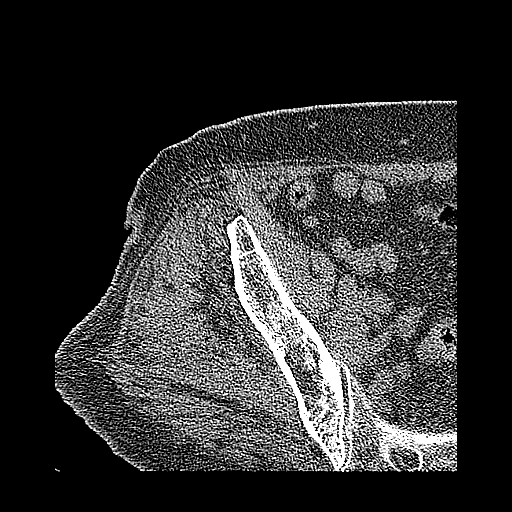
[im 204/230  lung]
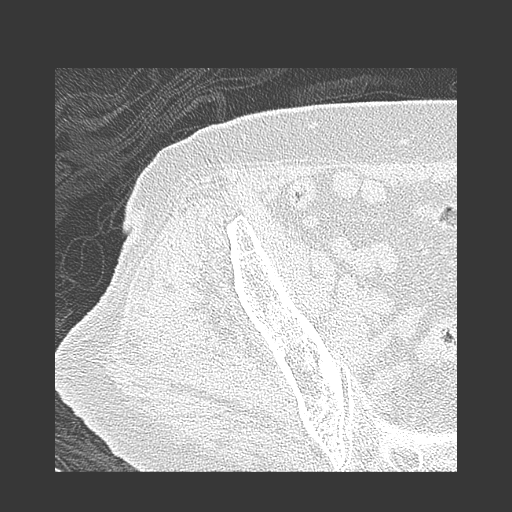
[im 217/230  soft-tissue]
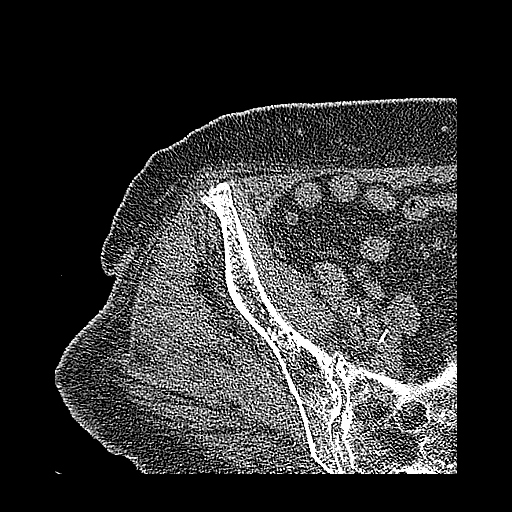
[im 217/230  lung]
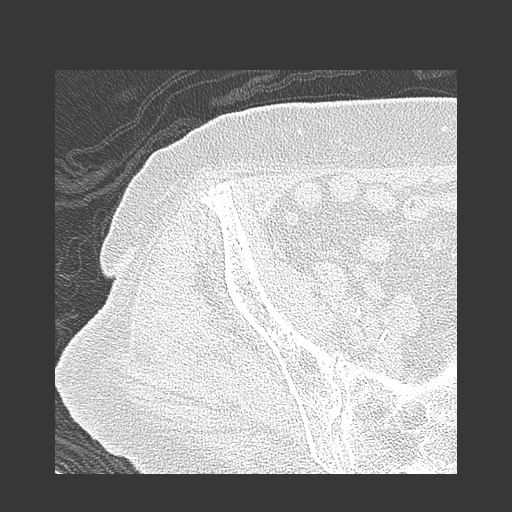

[15 of 32 positions shown; findings below may reference images not displayed]

FINDINGS: Nondisplaced fracture involving the right greater trochanter (series
4/image 52).

Right femoral neck is intact.

Mild degenerative changes of the right hip.

Visualized pelvic soft tissues are notable for vascular
calcifications but are otherwise within normal limits.
IMPRESSION: Nondisplaced fracture involving the right greater trochanter.
